# Patient Record
Sex: Female | Born: 1990 | Race: Black or African American | Hispanic: No | Marital: Single | Smoking: Never smoker
Health system: Southern US, Community
[De-identification: ages and names within clinical notes are randomized; demographics above are authoritative.]

## PROBLEM LIST (undated history)

## (undated) DIAGNOSIS — R112 Nausea with vomiting, unspecified: Secondary | ICD-10-CM

## (undated) DIAGNOSIS — R0602 Shortness of breath: Secondary | ICD-10-CM

## (undated) DIAGNOSIS — T4145XA Adverse effect of unspecified anesthetic, initial encounter: Secondary | ICD-10-CM

## (undated) DIAGNOSIS — R51 Headache: Secondary | ICD-10-CM

## (undated) DIAGNOSIS — R109 Unspecified abdominal pain: Secondary | ICD-10-CM

## (undated) DIAGNOSIS — T8859XA Other complications of anesthesia, initial encounter: Secondary | ICD-10-CM

## (undated) DIAGNOSIS — Z9889 Other specified postprocedural states: Secondary | ICD-10-CM

## (undated) DIAGNOSIS — R011 Cardiac murmur, unspecified: Secondary | ICD-10-CM

## (undated) HISTORY — PX: NO PAST SURGERIES: SHX2092

## (undated) HISTORY — DX: Cardiac murmur, unspecified: R01.1

## (undated) HISTORY — PX: REFRACTIVE SURGERY: SHX103

---
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
## 2010-01-22 ENCOUNTER — Inpatient Hospital Stay (HOSPITAL_COMMUNITY)
Admission: AD | Admit: 2010-01-22 | Discharge: 2010-01-22 | Disposition: A | Discharge status: Still In Hospital | Payer: Self-pay | Source: Home / Self Care | Attending: Obstetrics and Gynecology | Admitting: Obstetrics and Gynecology

## 2010-01-22 ENCOUNTER — Inpatient Hospital Stay (HOSPITAL_COMMUNITY)
Admission: AD | Admit: 2010-01-22 | Discharge: 2010-01-26 | Payer: Self-pay | Source: Home / Self Care | Attending: Obstetrics and Gynecology | Admitting: Obstetrics and Gynecology

## 2010-03-09 ENCOUNTER — Emergency Department (HOSPITAL_COMMUNITY)
Admission: EM | Admit: 2010-03-09 | Discharge: 2010-03-09 | Payer: Self-pay | Source: Home / Self Care | Admitting: Emergency Medicine

## 2010-04-12 ENCOUNTER — Ambulatory Visit: Payer: Self-pay | Admitting: Cardiology

## 2010-04-16 ENCOUNTER — Ambulatory Visit (INDEPENDENT_AMBULATORY_CARE_PROVIDER_SITE_OTHER): Payer: 59 | Admitting: Cardiology

## 2010-04-16 DIAGNOSIS — R011 Cardiac murmur, unspecified: Secondary | ICD-10-CM

## 2010-04-16 DIAGNOSIS — I379 Nonrheumatic pulmonary valve disorder, unspecified: Secondary | ICD-10-CM

## 2010-04-19 LAB — MRSA PCR SCREENING: MRSA by PCR: NEGATIVE

## 2010-04-19 LAB — URINALYSIS, ROUTINE W REFLEX MICROSCOPIC
Glucose, UA: NEGATIVE mg/dL
Ketones, ur: NEGATIVE mg/dL
Leukocytes, UA: NEGATIVE
Nitrite: NEGATIVE
Nitrite: NEGATIVE
Protein, ur: NEGATIVE mg/dL
Specific Gravity, Urine: 1.02 (ref 1.005–1.030)
pH: 7.5 (ref 5.0–8.0)

## 2010-04-19 LAB — COMPREHENSIVE METABOLIC PANEL
ALT: 14 U/L (ref 0–35)
Albumin: 2.7 g/dL — ABNORMAL LOW (ref 3.5–5.2)
Albumin: 3 g/dL — ABNORMAL LOW (ref 3.5–5.2)
Alkaline Phosphatase: 124 U/L — ABNORMAL HIGH (ref 39–117)
Alkaline Phosphatase: 197 U/L — ABNORMAL HIGH (ref 39–117)
Alkaline Phosphatase: 220 U/L — ABNORMAL HIGH (ref 39–117)
BUN: 2 mg/dL — ABNORMAL LOW (ref 6–23)
BUN: 7 mg/dL (ref 6–23)
Calcium: 9.3 mg/dL (ref 8.4–10.5)
Chloride: 105 mEq/L (ref 96–112)
Creatinine, Ser: 0.63 mg/dL (ref 0.4–1.2)
Glucose, Bld: 99 mg/dL (ref 70–99)
Potassium: 3.7 mEq/L (ref 3.5–5.1)
Potassium: 3.7 mEq/L (ref 3.5–5.1)
Potassium: 4.3 mEq/L (ref 3.5–5.1)
Sodium: 137 mEq/L (ref 135–145)
Total Bilirubin: 0.4 mg/dL (ref 0.3–1.2)
Total Protein: 5.2 g/dL — ABNORMAL LOW (ref 6.0–8.3)
Total Protein: 6.2 g/dL (ref 6.0–8.3)
Total Protein: 6.6 g/dL (ref 6.0–8.3)

## 2010-04-19 LAB — CBC
HCT: 32.4 % — ABNORMAL LOW (ref 36.0–46.0)
Hemoglobin: 11.6 g/dL — ABNORMAL LOW (ref 12.0–15.0)
Hemoglobin: 8.8 g/dL — ABNORMAL LOW (ref 12.0–15.0)
MCH: 32.1 pg (ref 26.0–34.0)
MCH: 32.4 pg (ref 26.0–34.0)
MCHC: 35.2 g/dL (ref 30.0–36.0)
MCHC: 35.8 g/dL (ref 30.0–36.0)
MCV: 89.8 fL (ref 78.0–100.0)
Platelets: 163 10*3/uL (ref 150–400)
Platelets: 179 10*3/uL (ref 150–400)
Platelets: 190 10*3/uL (ref 150–400)
Platelets: 218 10*3/uL (ref 150–400)
RBC: 2.77 MIL/uL — ABNORMAL LOW (ref 3.87–5.11)
RBC: 2.78 MIL/uL — ABNORMAL LOW (ref 3.87–5.11)
RBC: 3.61 MIL/uL — ABNORMAL LOW (ref 3.87–5.11)
RBC: 4.01 MIL/uL (ref 3.87–5.11)
RDW: 12.6 % (ref 11.5–15.5)
RDW: 12.7 % (ref 11.5–15.5)
WBC: 5.6 10*3/uL (ref 4.0–10.5)
WBC: 8.4 10*3/uL (ref 4.0–10.5)

## 2010-04-19 LAB — URIC ACID
Uric Acid, Serum: 4 mg/dL (ref 2.4–7.0)
Uric Acid, Serum: 4.4 mg/dL (ref 2.4–7.0)

## 2010-04-19 LAB — URINE CULTURE
Colony Count: NO GROWTH
Culture: NO GROWTH

## 2010-04-19 LAB — URINE MICROSCOPIC-ADD ON

## 2010-04-19 LAB — LACTATE DEHYDROGENASE: LDH: 186 U/L (ref 94–250)

## 2010-04-19 LAB — WET PREP, GENITAL
Trich, Wet Prep: NONE SEEN
Yeast Wet Prep HPF POC: NONE SEEN

## 2010-04-19 LAB — RPR: RPR Ser Ql: NONREACTIVE

## 2010-04-19 LAB — PROTEIN, URINE, 24 HOUR
Collection Interval-UPROT: 24 hours
Protein, Urine: 13 mg/dL
Urine Total Volume-UPROT: 2925 mL

## 2010-04-24 LAB — URINALYSIS, ROUTINE W REFLEX MICROSCOPIC
Bilirubin Urine: NEGATIVE
Ketones, ur: NEGATIVE mg/dL
Nitrite: NEGATIVE
Urobilinogen, UA: 1 mg/dL (ref 0.0–1.0)

## 2010-04-27 LAB — CBC
Hemoglobin: 12.8 g/dL (ref 12.0–15.0)
MCHC: 34.9 g/dL (ref 30.0–36.0)
MCV: 95.9 fL (ref 78.0–100.0)
RBC: 3.81 MIL/uL — ABNORMAL LOW (ref 3.87–5.11)

## 2010-04-27 LAB — URINE MICROSCOPIC-ADD ON

## 2010-04-27 LAB — GC/CHLAMYDIA PROBE AMP, GENITAL: GC Probe Amp, Genital: NEGATIVE

## 2010-04-27 LAB — POCT PREGNANCY, URINE: Preg Test, Ur: POSITIVE

## 2010-04-27 LAB — ABO/RH: ABO/RH(D): B POS

## 2010-04-27 LAB — URINALYSIS, ROUTINE W REFLEX MICROSCOPIC
Glucose, UA: NEGATIVE mg/dL
Hgb urine dipstick: NEGATIVE
Specific Gravity, Urine: 1.02 (ref 1.005–1.030)
pH: 7.5 (ref 5.0–8.0)

## 2010-04-27 LAB — WET PREP, GENITAL: Clue Cells Wet Prep HPF POC: NONE SEEN

## 2010-05-03 ENCOUNTER — Other Ambulatory Visit (HOSPITAL_COMMUNITY): Payer: Self-pay | Admitting: Radiology

## 2010-05-03 ENCOUNTER — Other Ambulatory Visit (HOSPITAL_COMMUNITY): Payer: 59 | Admitting: Radiology

## 2010-05-03 DIAGNOSIS — R011 Cardiac murmur, unspecified: Secondary | ICD-10-CM

## 2010-05-14 LAB — WET PREP, GENITAL
Trich, Wet Prep: NONE SEEN
WBC, Wet Prep HPF POC: NONE SEEN

## 2010-05-14 LAB — POCT I-STAT, CHEM 8
Chloride: 109 mEq/L (ref 96–112)
Glucose, Bld: 94 mg/dL (ref 70–99)
HCT: 37 % (ref 36.0–46.0)
Potassium: 3.7 mEq/L (ref 3.5–5.1)

## 2010-08-25 ENCOUNTER — Emergency Department (HOSPITAL_COMMUNITY): Payer: 59

## 2010-08-25 ENCOUNTER — Emergency Department (HOSPITAL_COMMUNITY)
Admission: EM | Admit: 2010-08-25 | Discharge: 2010-08-25 | Disposition: A | Discharge status: Home / Self Care | Payer: 59 | Attending: Emergency Medicine | Admitting: Emergency Medicine

## 2010-08-25 DIAGNOSIS — S0990XA Unspecified injury of head, initial encounter: Secondary | ICD-10-CM | POA: Insufficient documentation

## 2010-08-25 DIAGNOSIS — S20219A Contusion of unspecified front wall of thorax, initial encounter: Secondary | ICD-10-CM | POA: Insufficient documentation

## 2010-08-25 DIAGNOSIS — J3489 Other specified disorders of nose and nasal sinuses: Secondary | ICD-10-CM | POA: Insufficient documentation

## 2010-08-25 DIAGNOSIS — R071 Chest pain on breathing: Secondary | ICD-10-CM | POA: Insufficient documentation

## 2010-08-25 DIAGNOSIS — M542 Cervicalgia: Secondary | ICD-10-CM | POA: Insufficient documentation

## 2010-08-25 DIAGNOSIS — S139XXA Sprain of joints and ligaments of unspecified parts of neck, initial encounter: Secondary | ICD-10-CM | POA: Insufficient documentation

## 2010-08-25 DIAGNOSIS — R51 Headache: Secondary | ICD-10-CM | POA: Insufficient documentation

## 2010-11-11 ENCOUNTER — Inpatient Hospital Stay (INDEPENDENT_AMBULATORY_CARE_PROVIDER_SITE_OTHER)
Admission: RE | Admit: 2010-11-11 | Discharge: 2010-11-11 | Disposition: A | Discharge status: Home / Self Care | Payer: 59 | Source: Ambulatory Visit | Attending: Family Medicine | Admitting: Family Medicine

## 2010-11-11 DIAGNOSIS — Z0489 Encounter for examination and observation for other specified reasons: Secondary | ICD-10-CM

## 2010-11-11 LAB — POCT URINALYSIS DIP (DEVICE)
Glucose, UA: NEGATIVE mg/dL
Hgb urine dipstick: NEGATIVE
Nitrite: NEGATIVE
Protein, ur: 30 mg/dL — AB
Urobilinogen, UA: 0.2 mg/dL (ref 0.0–1.0)
pH: 7 (ref 5.0–8.0)

## 2010-11-11 LAB — WET PREP, GENITAL: Yeast Wet Prep HPF POC: NONE SEEN

## 2010-11-12 LAB — GC/CHLAMYDIA PROBE AMP, GENITAL
Chlamydia, DNA Probe: NEGATIVE
GC Probe Amp, Genital: NEGATIVE
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
## 2011-05-18 ENCOUNTER — Encounter: Payer: Self-pay | Admitting: *Deleted

## 2011-09-17 ENCOUNTER — Emergency Department (HOSPITAL_COMMUNITY): Payer: Self-pay

## 2011-09-17 ENCOUNTER — Emergency Department (HOSPITAL_COMMUNITY)
Admission: EM | Admit: 2011-09-17 | Discharge: 2011-09-17 | Disposition: A | Discharge status: Home / Self Care | Payer: Self-pay | Attending: Emergency Medicine | Admitting: Emergency Medicine

## 2011-09-17 DIAGNOSIS — S8391XA Sprain of unspecified site of right knee, initial encounter: Secondary | ICD-10-CM

## 2011-09-17 DIAGNOSIS — IMO0002 Reserved for concepts with insufficient information to code with codable children: Secondary | ICD-10-CM | POA: Insufficient documentation

## 2011-09-17 DIAGNOSIS — W108XXA Fall (on) (from) other stairs and steps, initial encounter: Secondary | ICD-10-CM | POA: Insufficient documentation

## 2011-09-17 MED ORDER — IBUPROFEN 600 MG PO TABS: 600.0000 mg | ORAL_TABLET | Freq: Four times a day (QID) | ORAL | Status: AC | PRN

## 2011-09-17 NOTE — ED Notes (Signed)
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

## 2011-09-17 NOTE — ED Provider Notes (Signed)
History     CSN: [id number]  Arrival date & time 09/17/11  2011   First MD Initiated Contact with Patient 09/17/11 2034      Chief Complaint  Patient presents with  . Knee Pain    (Consider location/radiation/quality/duration/timing/severity/associated sxs/prior treatment) HPI Hx from pt. Jaime Shelton is a 20 y.o. female presenting with c/o R knee pain. States she fell down some steps last weekend landing on the knee. States the pain has worsened since that time. Has been able to wt bear without difficulty. She works as a Advertising copywriter at [employer] and has been working a lot over the past week which she thinks may be making the pain worse. Pain located to medial aspect of knee, worse with palpation or activity. No distal numbness/weakness  Past Medical History  Diagnosis Date  . Heart murmur     No past surgical history on file.  Family History  Problem Relation Age of Onset  . HIV Father     History  Substance Use Topics  . Smoking status: Never Smoker   . Smokeless tobacco: Not on file  . Alcohol Use: No    OB History    Grav Para Term Preterm Abortions TAB SAB Ect Mult Living                  Review of Systems per HPI  Allergies  Review of patient's allergies indicates no known allergies.  Home Medications  No current outpatient prescriptions on file.  BP 133/67  Pulse 83  Temp 98.5 F (36.9 C) (Oral)  Resp 15  Ht 5\' 4"  (1.626 m)  Wt 112 lb (50.803 kg)  BMI 19.22 kg/m2  SpO2 100%  LMP 09/17/2011  Physical Exam  Nursing note and vitals reviewed. Constitutional: She appears well-developed and well-nourished. No distress.  HENT:  Head: Normocephalic and atraumatic.  Neck: Normal range of motion.  Cardiovascular: Normal rate.   Pulmonary/Chest: Effort normal.  Musculoskeletal: Normal range of motion.       R knee: ttp over medial bony aspect of knee. No edema, erythema, crepitus, effusion. Ligaments grossly intact to drawer/stress. Neg McMurrays. NVI  distally  Neurological: She is alert.  Skin: Skin is warm and dry. She is not diaphoretic.  Psychiatric: She has a normal mood and affect.    ED Course  Procedures (including critical care time)  Labs Reviewed - No data to display Dg Knee Complete 4 Views Right  09/17/2011  *RADIOLOGY REPORT*  Clinical Data: Fall  RIGHT KNEE - COMPLETE 4+ VIEW  Comparison: None.  Findings: No fracture or dislocation of the right knee.  No joint effusion.  IMPRESSION: No fracture or effusion.  Original Report Authenticated By: Genevive Bi, M.D.     1. Right knee sprain       MDM  Pt with ttp over medial bony aspect of knee after falling on her knee one week ago. No evidence of ligamentous injury on exam, xrays reviewed by me and neg. Pt advised RICE, NSAIDS. Reasons to return discussed.        Grant Fontana, PA-C 09/18/11 0041

## 2011-09-19 NOTE — ED Provider Notes (Signed)
Medical screening examination/treatment/procedure(s) were performed by non-physician practitioner and as supervising physician I was immediately available for consultation/collaboration. Devoria Albe, MD, Armando Gang   Ward Givens, MD 09/19/11 [phone]

## 2011-11-15 ENCOUNTER — Emergency Department (HOSPITAL_BASED_OUTPATIENT_CLINIC_OR_DEPARTMENT_OTHER)
Admission: EM | Admit: 2011-11-15 | Discharge: 2011-11-15 | Disposition: A | Discharge status: Home / Self Care | Payer: Medicaid Other | Attending: Emergency Medicine | Admitting: Emergency Medicine

## 2011-11-15 ENCOUNTER — Encounter (HOSPITAL_BASED_OUTPATIENT_CLINIC_OR_DEPARTMENT_OTHER): Payer: Self-pay

## 2011-11-15 DIAGNOSIS — M7989 Other specified soft tissue disorders: Secondary | ICD-10-CM | POA: Insufficient documentation

## 2011-11-15 MED ORDER — CEPHALEXIN 500 MG PO CAPS: 500.0000 mg | ORAL_CAPSULE | Freq: Four times a day (QID) | ORAL | Status: DC

## 2011-11-15 NOTE — ED Provider Notes (Signed)
History     CSN: [id number]  Arrival date & time 11/15/11  1328   First MD Initiated Contact with Patient 11/15/11 1416      Chief Complaint  Patient presents with  . Wound Infection    (Consider location/radiation/quality/duration/timing/severity/associated sxs/prior treatment) HPI Patient complaining of swelling in left axilla. It has been there for several months. She has not increased in size. She has not any drainage, redness, fever, or lines from this area. She states that she had a similar area in the right axilla which did become enlarged and required drainage of the Past Medical History  Diagnosis Date  . Heart murmur     History reviewed. No pertinent past surgical history.  Family History  Problem Relation Age of Onset  . HIV Father     History  Substance Use Topics  . Smoking status: Never Smoker   . Smokeless tobacco: Not on file  . Alcohol Use: No    OB History    Grav Para Term Preterm Abortions TAB SAB Ect Mult Living                  Review of Systems  Constitutional: Negative for fever, diaphoresis, appetite change, fatigue and unexpected weight change.  HENT: Negative for mouth sores and neck stiffness.   Eyes: Negative for visual disturbance.  Respiratory: Negative for cough, chest tightness, shortness of breath and wheezing.   Cardiovascular: Negative for chest pain.  Gastrointestinal: Negative for nausea, vomiting, abdominal pain, diarrhea and constipation.  Genitourinary: Negative for dysuria, urgency, frequency and hematuria.  Skin: Negative for rash.  Neurological: Negative for syncope, light-headedness and headaches.  Psychiatric/Behavioral: Negative for disturbed wake/sleep cycle. The patient is not nervous/anxious.     Allergies  Review of patient's allergies indicates no known allergies.  Home Medications  No current outpatient prescriptions on file.  BP 143/79  Pulse 56  Temp 98.5 F (36.9 C) (Oral)  Resp 16  Ht 5\' 4"   (1.626 m)  Wt 102 lb (46.267 kg)  BMI 17.51 kg/m2  LMP 10/12/2011  Physical Exam  Nursing note and vitals reviewed. Constitutional: She appears well-developed and well-nourished.  HENT:  Head: Normocephalic and atraumatic.  Eyes: Conjunctivae normal are normal. Pupils are equal, round, and reactive to light.  Neck: Normal range of motion. Neck supple.  Cardiovascular: Normal rate and regular rhythm.   Pulmonary/Chest: Effort normal and breath sounds normal.  Abdominal: Soft. Bowel sounds are normal.  Musculoskeletal: Normal range of motion.  Skin:       1 cm well circumscribed nodule left axilla with no tenderness and no erythema or streaking.  Psychiatric: She has a normal mood and affect. Her behavior is normal. Thought content normal.    ED Course  Procedures (including critical care time)  Labs Reviewed - No data to display No results found.   No diagnosis found.    MDM  Patient with swelling in left axilla that is nontender and well delineated. She'll be placed on antibiotics and given referral for followup if it does not resolve       Hilario Quarry, MD 11/15/11 1630

## 2011-11-15 NOTE — ED Notes (Signed)
Pt reports a knot in left axilla x 3 months.

## 2011-12-08 ENCOUNTER — Emergency Department (HOSPITAL_COMMUNITY)
Admission: EM | Admit: 2011-12-08 | Discharge: 2011-12-08 | Disposition: A | Discharge status: Home / Self Care | Payer: Medicaid Other | Attending: Emergency Medicine | Admitting: Emergency Medicine

## 2011-12-08 ENCOUNTER — Encounter (HOSPITAL_COMMUNITY): Payer: Self-pay | Admitting: *Deleted

## 2011-12-08 DIAGNOSIS — Z8679 Personal history of other diseases of the circulatory system: Secondary | ICD-10-CM | POA: Insufficient documentation

## 2011-12-08 DIAGNOSIS — R51 Headache: Secondary | ICD-10-CM | POA: Insufficient documentation

## 2011-12-08 DIAGNOSIS — H539 Unspecified visual disturbance: Secondary | ICD-10-CM | POA: Insufficient documentation

## 2011-12-08 MED ORDER — KETOROLAC TROMETHAMINE 30 MG/ML IJ SOLN
30.0000 mg | Freq: Once | INTRAMUSCULAR | Status: AC
  Administered 2011-12-08: 30 mg via INTRAMUSCULAR
  Filled 2011-12-08: qty 1

## 2011-12-08 NOTE — ED Provider Notes (Signed)
Medical screening examination/treatment/procedure(s) were performed by non-physician practitioner and as supervising physician I was immediately available for consultation/collaboration.  Kristan Brummitt M Vincenta Steffey, MD 12/08/11 0537 

## 2011-12-08 NOTE — ED Notes (Signed)
Headache for 2 years getting more worse with more frequency
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
## 2012-06-13 ENCOUNTER — Ambulatory Visit (INDEPENDENT_AMBULATORY_CARE_PROVIDER_SITE_OTHER): Payer: Medicaid Other | Admitting: General Surgery

## 2012-06-22 ENCOUNTER — Encounter (INDEPENDENT_AMBULATORY_CARE_PROVIDER_SITE_OTHER): Payer: Self-pay | Admitting: General Surgery

## 2012-06-22 ENCOUNTER — Ambulatory Visit (INDEPENDENT_AMBULATORY_CARE_PROVIDER_SITE_OTHER): Payer: Medicaid Other | Admitting: General Surgery

## 2012-06-22 VITALS — BP 118/62 | HR 62 | Temp 98.0°F | Resp 18 | Ht 64.0 in | Wt 103.3 lb

## 2012-06-22 DIAGNOSIS — L729 Follicular cyst of the skin and subcutaneous tissue, unspecified: Secondary | ICD-10-CM

## 2012-06-22 DIAGNOSIS — L723 Sebaceous cyst: Secondary | ICD-10-CM

## 2012-06-22 NOTE — Progress Notes (Signed)
Patient ID: Jaime Shelton, female   DOB: Dec 03, 1990, 22 y.o.   MRN: [id number]  Chief Complaint  Patient presents with  . New Evaluation    left axilla cyst    HPI Jaime Shelton is a 22 y.o. female.  This patient was referred by Dr. Bruna Potter for evaluation of axillary mass. She said that she had a similar mass under her right axilla but this spontaneously ruptured and hasn't caused any problems since. With this left armpit lesion she first several months ago and started out as a small mass and has slowly increased in size. She says it doesn't cause any discomfort and has not had any drainage or redness. She denies any fevers or chills. She did go to the emergency room for evaluation of this back in October and she was told to follow up with surgery HPI  Past Medical History  Diagnosis Date  . Heart murmur     No past surgical history on file.  Family History  Problem Relation Age of Onset  . HIV Father   . Hypertension Mother     Social History History  Substance Use Topics  . Smoking status: Never Smoker   . Smokeless tobacco: Not on file  . Alcohol Use: No    No Known Allergies  No current outpatient prescriptions on file.   No current facility-administered medications for this visit.    Review of Systems Review of Systems All other review of systems negative or noncontributory except as stated in the HPI  Blood pressure 118/62, pulse 62, temperature 98 F (36.7 C), resp. rate 18, height 5\' 4"  (1.626 m), weight 103 lb 4.8 oz (46.857 kg).  Physical Exam Physical Exam Physical Exam  Nursing note and vitals reviewed. Constitutional: She is oriented to person, place, and time. She appears well-developed and well-nourished, though very thin. No distress.  HENT:  Head: Normocephalic and atraumatic.  Mouth/Throat: No oropharyngeal exudate.  Eyes: Conjunctivae and EOM are normal. Pupils are equal, round, and reactive to light. Right eye exhibits no discharge. Left eye  exhibits no discharge. No scleral icterus.  Neck: Normal range of motion. Neck supple. No tracheal deviation present.  Cardiovascular: Normal rate, regular rhythm, normal heart sounds and intact distal pulses.   Pulmonary/Chest: Effort normal and breath sounds normal. No stridor. No respiratory distress. She has no wheezes.  Abdominal: Soft. Bowel sounds are normal. She exhibits no distension and no mass. There is no tenderness. There is no rebound and no guarding.  Musculoskeletal: Normal range of motion. She exhibits no edema and no tenderness.  Neurological: She is alert and oriented to person, place, and time.  Skin: Skin is warm and dry. No rash noted. She is not diaphoretic. No erythema. No pallor. She has a 2cm mobile and well circumscribed nodule in the left axilla without any tenderness, erythema, or drainage Psychiatric: She has a normal mood and affect. Her behavior is normal. Judgment and thought content normal.    Data Reviewed ER notes  Assessment    Left axillary mass  think that this most likely represents a benignSebaceous cyst.  There is no evidence of active infection. This could also be a lymph node as well as but given her history of a prior cyst which drained on the contralateral side, and I think that this is likely also a similar lesion. Discussed with her the options for continued observation versus excisional biopsy and she would like to have this removed. We discussed the procedure and the risks  including infection, bleeding, pain, scarring, recurrence, nerve injury, lymphedema and lymhocele and she expressed understanding and would like to proceed with excision of left axillary mass    Plan    We will plan to set her up for surgical excision        Nashly Olsson DAVID 06/22/2012, 11:39 AM

## 2012-06-25 ENCOUNTER — Encounter (HOSPITAL_COMMUNITY): Payer: Self-pay

## 2012-06-25 ENCOUNTER — Encounter (HOSPITAL_COMMUNITY)
Admission: RE | Admit: 2012-06-25 | Discharge: 2012-06-25 | Disposition: A | Discharge status: Home / Self Care | Payer: Medicaid Other | Source: Ambulatory Visit | Attending: General Surgery | Admitting: General Surgery

## 2012-06-25 VITALS — BP 134/74 | HR 79 | Temp 98.9°F | Resp 20 | Ht 64.0 in | Wt 102.7 lb

## 2012-06-25 DIAGNOSIS — L729 Follicular cyst of the skin and subcutaneous tissue, unspecified: Secondary | ICD-10-CM

## 2012-06-25 HISTORY — DX: Shortness of breath: R06.02

## 2012-06-25 HISTORY — DX: Other specified postprocedural states: R11.2

## 2012-06-25 HISTORY — DX: Headache: R51

## 2012-06-25 HISTORY — DX: Other specified postprocedural states: Z98.890

## 2012-06-25 HISTORY — DX: Unspecified abdominal pain: R10.9

## 2012-06-25 HISTORY — DX: Adverse effect of unspecified anesthetic, initial encounter: T41.45XA

## 2012-06-25 HISTORY — DX: Other complications of anesthesia, initial encounter: T88.59XA

## 2012-06-25 LAB — CBC
HCT: 34.3 % — ABNORMAL LOW (ref 36.0–46.0)
MCH: 30.7 pg (ref 26.0–34.0)
MCHC: 35 g/dL (ref 30.0–36.0)
MCV: 87.7 fL (ref 78.0–100.0)
RDW: 11.8 % (ref 11.5–15.5)

## 2012-06-25 MED ORDER — CEFAZOLIN SODIUM-DEXTROSE 2-3 GM-% IV SOLR
2.0000 g | INTRAVENOUS | Status: AC
  Administered 2012-06-26: 2 g via INTRAVENOUS
  Filled 2012-06-25: qty 50

## 2012-06-25 NOTE — Pre-Procedure Instructions (Signed)
Shelagh Rayman  06/25/2012   Your procedure is scheduled on: Tuesday, Jun 26, 2012  Report to [facility] at  8:45 AM.  Call this number if you have problems the morning of surgery: [phone]   Remember:   Do not eat food or drink liquids after midnight.   Take these medicines the morning of surgery with A SIP OF WATER:              Stop taking Aspirin,and herbal medications. Do not take any NSAIDs ie: Ibuprofen, Advil, Naproxen or any medication containing Aspirin.  Do not wear jewelry, make-up or nail polish.  Do not wear lotions, powders, or perfumes. You may wear deodorant.  Do not shave 48 hours prior to surgery. Men may shave face and neck.  Do not bring valuables to the hospital.  Contacts, dentures or bridgework may not be worn into surgery.  Leave suitcase in the car. After surgery it may be brought to your room.  For patients admitted to the hospital, checkout time is 11:00 AM the day of discharge.   Patients discharged the day of surgery will not be allowed to drive home.  Name and phone number of your driver:   Special Instructions: Shower using CHG 2 nights before surgery and the night before surgery.  If you shower the day of surgery use CHG.  Use special wash - you have one bottle of CHG for all showers.  You should use approximately 1/3 of the bottle for each shower.   Please read over the following fact sheets that you were given: Pain Booklet, Coughing and Deep Breathing and Surgical Site Infection Prevention

## 2012-06-26 ENCOUNTER — Ambulatory Visit (HOSPITAL_COMMUNITY)
Admission: RE | Admit: 2012-06-26 | Discharge: 2012-06-26 | Disposition: A | Discharge status: Home / Self Care | Payer: Medicaid Other | Source: Ambulatory Visit | Attending: General Surgery | Admitting: General Surgery

## 2012-06-26 ENCOUNTER — Encounter (HOSPITAL_COMMUNITY): Payer: Self-pay | Admitting: Certified Registered"

## 2012-06-26 ENCOUNTER — Ambulatory Visit (HOSPITAL_COMMUNITY): Payer: Medicaid Other | Admitting: Certified Registered"

## 2012-06-26 ENCOUNTER — Encounter (HOSPITAL_COMMUNITY): Payer: Self-pay | Admitting: *Deleted

## 2012-06-26 ENCOUNTER — Encounter (HOSPITAL_COMMUNITY)
Admission: RE | Disposition: A | Discharge status: Home / Self Care | Payer: Self-pay | Source: Ambulatory Visit | Attending: General Surgery

## 2012-06-26 DIAGNOSIS — R011 Cardiac murmur, unspecified: Secondary | ICD-10-CM | POA: Insufficient documentation

## 2012-06-26 DIAGNOSIS — L723 Sebaceous cyst: Secondary | ICD-10-CM

## 2012-06-26 DIAGNOSIS — L729 Follicular cyst of the skin and subcutaneous tissue, unspecified: Secondary | ICD-10-CM

## 2012-06-26 HISTORY — PX: EAR CYST EXCISION: SHX22

## 2012-06-26 HISTORY — PX: AXILLARY SURGERY: SHX892

## 2012-06-26 SURGERY — CYST REMOVAL
Anesthesia: General | Site: Axilla | Laterality: Left | Wound class: Clean

## 2012-06-26 MED ORDER — OXYCODONE HCL 5 MG/5ML PO SOLN: 5.0000 mg | Freq: Once | ORAL | Status: DC | PRN

## 2012-06-26 MED ORDER — LIDOCAINE-EPINEPHRINE (PF) 1 %-1:200000 IJ SOLN
INTRAMUSCULAR | Status: DC | PRN
  Administered 2012-06-26: 11:00:00

## 2012-06-26 MED ORDER — HYDROMORPHONE HCL PF 1 MG/ML IJ SOLN: 0.2500 mg | INTRAMUSCULAR | Status: DC | PRN

## 2012-06-26 MED ORDER — ARTIFICIAL TEARS OP OINT
TOPICAL_OINTMENT | OPHTHALMIC | Status: DC | PRN
  Administered 2012-06-26: 1 via OPHTHALMIC

## 2012-06-26 MED ORDER — PROPOFOL 10 MG/ML IV BOLUS
INTRAVENOUS | Status: DC | PRN
  Administered 2012-06-26: 140 mg via INTRAVENOUS

## 2012-06-26 MED ORDER — DEXAMETHASONE SODIUM PHOSPHATE 10 MG/ML IJ SOLN
INTRAMUSCULAR | Status: DC | PRN
  Administered 2012-06-26: 8 mg via INTRAVENOUS

## 2012-06-26 MED ORDER — LACTATED RINGERS IV SOLN
INTRAVENOUS | Status: DC | PRN
  Administered 2012-06-26: 11:00:00 via INTRAVENOUS

## 2012-06-26 MED ORDER — 0.9 % SODIUM CHLORIDE (POUR BTL) OPTIME
TOPICAL | Status: DC | PRN
  Administered 2012-06-26: 1000 mL

## 2012-06-26 MED ORDER — ONDANSETRON HCL 4 MG/2ML IJ SOLN
INTRAMUSCULAR | Status: DC | PRN
  Administered 2012-06-26: 4 mg via INTRAVENOUS

## 2012-06-26 MED ORDER — LIDOCAINE HCL (CARDIAC) 20 MG/ML IV SOLN
INTRAVENOUS | Status: DC | PRN
  Administered 2012-06-26: 100 mg via INTRAVENOUS

## 2012-06-26 MED ORDER — OXYCODONE HCL 5 MG PO TABS: 5.0000 mg | ORAL_TABLET | Freq: Once | ORAL | Status: DC | PRN

## 2012-06-26 MED ORDER — FENTANYL CITRATE 0.05 MG/ML IJ SOLN
INTRAMUSCULAR | Status: DC | PRN
Start: 2012-06-26 — End: 2012-06-26
  Administered 2012-06-26: 100 ug via INTRAVENOUS

## 2012-06-26 MED ORDER — MIDAZOLAM HCL 5 MG/5ML IJ SOLN
INTRAMUSCULAR | Status: DC | PRN
  Administered 2012-06-26: 2 mg via INTRAVENOUS

## 2012-06-26 MED ORDER — LACTATED RINGERS IV SOLN
INTRAVENOUS | Status: DC
  Administered 2012-06-26: 10:00:00 via INTRAVENOUS

## 2012-06-26 MED ORDER — BUPIVACAINE HCL (PF) 0.25 % IJ SOLN
INTRAMUSCULAR | Status: AC
  Filled 2012-06-26: qty 30

## 2012-06-26 MED ORDER — LIDOCAINE-EPINEPHRINE (PF) 1 %-1:200000 IJ SOLN
INTRAMUSCULAR | Status: AC
  Filled 2012-06-26: qty 10

## 2012-06-26 MED ORDER — BUPIVACAINE-EPINEPHRINE PF 0.25-1:200000 % IJ SOLN
INTRAMUSCULAR | Status: AC
  Filled 2012-06-26: qty 30

## 2012-06-26 MED ORDER — OXYCODONE HCL 5 MG PO TABS
5.0000 mg | ORAL_TABLET | Freq: Four times a day (QID) | ORAL | Status: DC | PRN
Start: 2012-06-26 — End: 2012-08-31

## 2012-06-26 SURGICAL SUPPLY — 48 items
ADH SKN CLS APL DERMABOND .7 (GAUZE/BANDAGES/DRESSINGS)
APL SKNCLS STERI-STRIP NONHPOA (GAUZE/BANDAGES/DRESSINGS) ×1
BENZOIN TINCTURE PRP APPL 2/3 (GAUZE/BANDAGES/DRESSINGS) ×1 IMPLANT
BLADE SURG 10 STRL SS (BLADE) ×2 IMPLANT
BLADE SURG 15 STRL LF DISP TIS (BLADE) ×1 IMPLANT
BLADE SURG 15 STRL SS (BLADE) ×2
CANISTER SUCTION 2500CC (MISCELLANEOUS) IMPLANT
CLOTH BEACON ORANGE TIMEOUT ST (SAFETY) ×2 IMPLANT
COVER SURGICAL LIGHT HANDLE (MISCELLANEOUS) ×2 IMPLANT
DECANTER SPIKE VIAL GLASS SM (MISCELLANEOUS) IMPLANT
DERMABOND ADVANCED (GAUZE/BANDAGES/DRESSINGS)
DERMABOND ADVANCED .7 DNX12 (GAUZE/BANDAGES/DRESSINGS) IMPLANT
DRAPE LAPAROSCOPIC ABDOMINAL (DRAPES) IMPLANT
DRAPE PED LAPAROTOMY (DRAPES) IMPLANT
DRAPE SURG 17X11 SM STRL (DRAPES) ×1 IMPLANT
DRAPE UTILITY 15X26 W/TAPE STR (DRAPE) ×4 IMPLANT
DRSG TEGADERM 4X4.75 (GAUZE/BANDAGES/DRESSINGS) IMPLANT
ELECT CAUTERY BLADE 6.4 (BLADE) ×2 IMPLANT
ELECT REM PT RETURN 9FT ADLT (ELECTROSURGICAL) ×2
ELECTRODE REM PT RTRN 9FT ADLT (ELECTROSURGICAL) ×1 IMPLANT
GLOVE BIOGEL PI IND STRL 7.0 (GLOVE) IMPLANT
GLOVE BIOGEL PI INDICATOR 7.0 (GLOVE) ×2
GLOVE SURG SS PI 7.0 STRL IVOR (GLOVE) ×1 IMPLANT
GLOVE SURG SS PI 7.5 STRL IVOR (GLOVE) ×4 IMPLANT
GOWN STRL NON-REIN LRG LVL3 (GOWN DISPOSABLE) ×2 IMPLANT
GOWN STRL REIN XL XLG (GOWN DISPOSABLE) ×2 IMPLANT
KIT BASIN OR (CUSTOM PROCEDURE TRAY) ×2 IMPLANT
KIT ROOM TURNOVER OR (KITS) ×2 IMPLANT
NDL HYPO 25X1 1.5 SAFETY (NEEDLE) ×1 IMPLANT
NEEDLE HYPO 25X1 1.5 SAFETY (NEEDLE) ×2 IMPLANT
NS IRRIG 1000ML POUR BTL (IV SOLUTION) ×2 IMPLANT
PACK SURGICAL SETUP 50X90 (CUSTOM PROCEDURE TRAY) ×2 IMPLANT
PAD ARMBOARD 7.5X6 YLW CONV (MISCELLANEOUS) ×2 IMPLANT
PENCIL BUTTON HOLSTER BLD 10FT (ELECTRODE) ×2 IMPLANT
SPECIMEN JAR MEDIUM (MISCELLANEOUS) ×2 IMPLANT
SPONGE GAUZE 4X4 12PLY (GAUZE/BANDAGES/DRESSINGS) ×1 IMPLANT
SPONGE LAP 18X18 X RAY DECT (DISPOSABLE) ×2 IMPLANT
STRIP CLOSURE SKIN 1/2X4 (GAUZE/BANDAGES/DRESSINGS) ×1 IMPLANT
SUT MNCRL AB 4-0 PS2 18 (SUTURE) ×2 IMPLANT
SUT VIC AB 3-0 SH 27 (SUTURE) ×2
SUT VIC AB 3-0 SH 27XBRD (SUTURE) ×1 IMPLANT
SYR BULB 3OZ (MISCELLANEOUS) ×2 IMPLANT
SYR CONTROL 10ML LL (SYRINGE) ×2 IMPLANT
TAPE CLOTH SURG 4X10 WHT LF (GAUZE/BANDAGES/DRESSINGS) ×1 IMPLANT
TOWEL OR 17X24 6PK STRL BLUE (TOWEL DISPOSABLE) ×2 IMPLANT
TOWEL OR 17X26 10 PK STRL BLUE (TOWEL DISPOSABLE) ×2 IMPLANT
TUBE CONNECTING 12X1/4 (SUCTIONS) IMPLANT
YANKAUER SUCT BULB TIP NO VENT (SUCTIONS) IMPLANT

## 2012-06-26 NOTE — Anesthesia Preprocedure Evaluation (Signed)
Anesthesia Evaluation  Patient identified by MRN, date of birth, ID band  Reviewed: Allergy & Precautions, H&P , NPO status , Patient's Chart, lab work & pertinent test results  History of Anesthesia Complications (+) PONV  Airway Mallampati: II TM Distance: >3 FB Neck ROM: Full    Dental no notable dental hx. (+) Teeth Intact and Dental Advisory Given   Pulmonary shortness of breath,  breath sounds clear to auscultation  Pulmonary exam normal       Cardiovascular + Valvular Problems/Murmurs Rhythm:Regular Rate:Normal     Neuro/Psych  Headaches, negative psych ROS   GI/Hepatic negative GI ROS, Neg liver ROS,   Endo/Other  negative endocrine ROS  Renal/GU negative Renal ROS  negative genitourinary   Musculoskeletal   Abdominal   Peds  Hematology negative hematology ROS (+)   Anesthesia Other Findings   Reproductive/Obstetrics negative OB ROS                           Anesthesia Physical Anesthesia Plan  ASA: II  Anesthesia Plan: General   Post-op Pain Management:    Induction: Intravenous  Airway Management Planned: LMA  Additional Equipment:   Intra-op Plan:   Post-operative Plan: Extubation in OR  Informed Consent: I have reviewed the patients History and Physical, chart, labs and discussed the procedure including the risks, benefits and alternatives for the proposed anesthesia with the patient or authorized representative who has indicated his/her understanding and acceptance.   Dental advisory given  Plan Discussed with: CRNA  Anesthesia Plan Comments:         Anesthesia Quick Evaluation

## 2012-06-26 NOTE — Brief Op Note (Signed)
06/26/2012  11:46 AM  PATIENT:  Jaime Shelton  22 y.o. female  PRE-OPERATIVE DIAGNOSIS:  skin cyst   POST-OPERATIVE DIAGNOSIS:  skin cyst   PROCEDURE:  Procedure(s): excison of left axillary CYST REMOVAL (Left)  SURGEON:  Surgeon(s) and Role:    * Lodema Pilot, DO - Primary  PHYSICIAN ASSISTANT:   ASSISTANTS: none   ANESTHESIA:   general  EBL:  Total I/O In: 500 [I.V.:500] Out: -   BLOOD ADMINISTERED:none  DRAINS: none   LOCAL MEDICATIONS USED:  MARCAINE    and LIDOCAINE   SPECIMEN:  Source of Specimen:  left axillary skin cyst  DISPOSITION OF SPECIMEN:  PATHOLOGY  COUNTS:  YES  TOURNIQUET:  * No tourniquets in log *  DICTATION: .Other Dictation: Dictation Number [id number]  PLAN OF CARE: Discharge to home after PACU  PATIENT DISPOSITION:  PACU - hemodynamically stable.   Delay start of Pharmacological VTE agent (>24hrs) due to surgical blood loss or risk of bleeding: no

## 2012-06-26 NOTE — Anesthesia Postprocedure Evaluation (Signed)
Anesthesia Post Note  Patient: Jaime Shelton  Procedure(s) Performed: Procedure(s) (LRB): excison of left axillary CYST REMOVAL (Left)  Anesthesia type: general  Patient location: PACU  Post pain: Pain level controlled  Post assessment: Patient's Cardiovascular Status Stable  Last Vitals:  Filed Vitals:   06/26/12 1254  BP:   Pulse: 70  Temp: 36.5 C  Resp: 16    Post vital signs: Reviewed and stable  Level of consciousness: sedated  Complications: No apparent anesthesia complications

## 2012-06-26 NOTE — H&P (View-Only) (Signed)
Patient ID: Jaime Shelton, female   DOB: 06/13/1990, 21 y.o.   MRN: [id number]  Chief Complaint  Patient presents with  . New Evaluation    left axilla cyst    HPI Jaime Shelton is a 21 y.o. female.  This patient was referred by Dr. Blount for evaluation of axillary mass. She said that she had a similar mass under her right axilla but this spontaneously ruptured and hasn't caused any problems since. With this left armpit lesion she first several months ago and started out as a small mass and has slowly increased in size. She says it doesn't cause any discomfort and has not had any drainage or redness. She denies any fevers or chills. She did go to the emergency room for evaluation of this back in October and she was told to follow up with surgery HPI  Past Medical History  Diagnosis Date  . Heart murmur     No past surgical history on file.  Family History  Problem Relation Age of Onset  . HIV Father   . Hypertension Mother     Social History History  Substance Use Topics  . Smoking status: Never Smoker   . Smokeless tobacco: Not on file  . Alcohol Use: No    No Known Allergies  No current outpatient prescriptions on file.   No current facility-administered medications for this visit.    Review of Systems Review of Systems All other review of systems negative or noncontributory except as stated in the HPI  Blood pressure 118/62, pulse 62, temperature 98 F (36.7 C), resp. rate 18, height 5' 4" (1.626 m), weight 103 lb 4.8 oz (46.857 kg).  Physical Exam Physical Exam Physical Exam  Nursing note and vitals reviewed. Constitutional: She is oriented to person, place, and time. She appears well-developed and well-nourished, though very thin. No distress.  HENT:  Head: Normocephalic and atraumatic.  Mouth/Throat: No oropharyngeal exudate.  Eyes: Conjunctivae and EOM are normal. Pupils are equal, round, and reactive to light. Right eye exhibits no discharge. Left eye  exhibits no discharge. No scleral icterus.  Neck: Normal range of motion. Neck supple. No tracheal deviation present.  Cardiovascular: Normal rate, regular rhythm, normal heart sounds and intact distal pulses.   Pulmonary/Chest: Effort normal and breath sounds normal. No stridor. No respiratory distress. She has no wheezes.  Abdominal: Soft. Bowel sounds are normal. She exhibits no distension and no mass. There is no tenderness. There is no rebound and no guarding.  Musculoskeletal: Normal range of motion. She exhibits no edema and no tenderness.  Neurological: She is alert and oriented to person, place, and time.  Skin: Skin is warm and dry. No rash noted. She is not diaphoretic. No erythema. No pallor. She has a 2cm mobile and well circumscribed nodule in the left axilla without any tenderness, erythema, or drainage Psychiatric: She has a normal mood and affect. Her behavior is normal. Judgment and thought content normal.    Data Reviewed ER notes  Assessment    Left axillary mass  think that this most likely represents a benignSebaceous cyst.  There is no evidence of active infection. This could also be a lymph node as well as but given her history of a prior cyst which drained on the contralateral side, and I think that this is likely also a similar lesion. Discussed with her the options for continued observation versus excisional biopsy and she would like to have this removed. We discussed the procedure and the risks   including infection, bleeding, pain, scarring, recurrence, nerve injury, lymphedema and lymhocele and she expressed understanding and would like to proceed with excision of left axillary mass    Plan    We will plan to set her up for surgical excision        Rejeana Fadness DAVID 06/22/2012, 11:39 AM    

## 2012-06-26 NOTE — Interval H&P Note (Signed)
History and Physical Interval Note:  06/26/2012 10:24 AM  Jaime Shelton  has presented today for surgery, with the diagnosis of skin cyst   The various methods of treatment have been discussed with the patient and family. After consideration of risks, benefits and other options for treatment, the patient has consented to  Procedure(s): excison of left axillary CYST REMOVAL (Left) as a surgical intervention .  The patient's history has been reviewed, patient examined, no change in status, stable for surgery.  I have reviewed the patient's chart and labs.  Questions were answered to the patient's satisfaction.  She was seen and examined with a chaperone.  Site marked with the patient agreement.  Risks of the procedure again discussed including infection, bleeding, pain, scarring, stricture, nerve injury, lymphocele and recurrence.  She desires to proceed with excision of left axillary mass.   Lodema Pilot DAVID

## 2012-06-26 NOTE — Transfer of Care (Signed)
Immediate Anesthesia Transfer of Care Note  Patient: Jaime Shelton  Procedure(s) Performed: Procedure(s): excison of left axillary CYST REMOVAL (Left)  Patient Location: PACU  Anesthesia Type:General  Level of Consciousness: awake, alert  and oriented  Airway & Oxygen Therapy: Patient Spontanous Breathing and Patient connected to nasal cannula oxygen  Post-op Assessment: Report given to PACU RN, Post -op Vital signs reviewed and stable and Patient moving all extremities X 4  Post vital signs: Reviewed and stable  Complications: No apparent anesthesia complications

## 2012-06-27 ENCOUNTER — Telehealth (INDEPENDENT_AMBULATORY_CARE_PROVIDER_SITE_OTHER): Payer: Self-pay | Admitting: General Surgery

## 2012-06-27 ENCOUNTER — Encounter (HOSPITAL_COMMUNITY): Payer: Self-pay | Admitting: General Surgery

## 2012-06-27 ENCOUNTER — Encounter (INDEPENDENT_AMBULATORY_CARE_PROVIDER_SITE_OTHER): Payer: Self-pay

## 2012-06-27 NOTE — Telephone Encounter (Signed)
Left message for patient to call for po f/u visit

## 2012-06-27 NOTE — Telephone Encounter (Signed)
Patient is coming in office around 230 to get a note for work . She states Dr Biagio Quint told her she would need to be out 1 week .I made Dr Biagio Quint nurse aware

## 2012-06-27 NOTE — Op Note (Signed)
NAME:  Jaime Shelton, Jaime Shelton NO.:  [id number]  MEDICAL RECORD NO.:  [id number]  LOCATION:  [facility]                         FACILITY:  [facility]  PHYSICIAN:  Lodema Pilot, MD       DATE OF BIRTH:  [birth year]  DATE OF PROCEDURE:  06/26/2012 DATE OF DISCHARGE:  06/26/2012                              OPERATIVE REPORT   PROCEDURE:  Excision of left axillary mass.  PREOPERATIVE DIAGNOSIS:  Axillary skin cyst.  POSTOPERATIVE DIAGNOSIS:  Axillary skin cyst.  SURGEON:  Lodema Pilot, MD  ASSISTANT:  None.  ANESTHESIA:  General LMA anesthesia with 19 mL of 1% lidocaine with epinephrine and 0.25% Marcaine in 50:50 mixture.  FLUID:  500 mL crystalloid.  ESTIMATED BLOOD LOSS:  Minimal.  DRAINS:  None.  SPECIMEN:  Left axillary skin cyst sent to pathology for permanent sectioning measuring 3 cm x 1.5 cm.  COMPLICATION:  None.  FINDINGS:  A 3 cm x 1.5 cm axillary skin cyst and cyst capsule was not entered during the dissection.  INDICATION OF PROCEDURE:  [name] is a 22 year old female with mass in the left axilla, which is increasing in size and desires excision.  OPERATIVE DETAILS:  [name] was seen evaluated in the preoperative area and risks and benefits of the procedure were again discussed in lay terms.  Informed consent was obtained and the surgical site was marked prior to anesthetic administration.  She was given prophylactic antibiotics and taken to the operating room, placed on table in supine position and general LMA anesthesia was obtained.  Her left axilla was prepped and draped in the standard surgical fashion.  Procedure time-out was performed with all operative team members to confirm proper patient, procedure.  An elliptical incision was made in the skin overlying the palpable mass and dissection was carried down to subcutaneous tissue using sharp dissection.  I dissected using sharp dissection along the cystic capsule taking care not to enter the  cyst and dissected circumferentially around this.  Entered the normal appearing subcutaneous tissue and carried this dissection around the cyst.  Again, the cyst was not entered.  The lesion was completely excised and measured on the back table measuring 3 cm long x 1.5 cm wide and sent to Pathology for permanent sectioning.  The wound was then irrigated with sterile saline solution and hemostasis was obtained with electrocautery. I really did not enter any of the axillary fat.  This was allowed to subcutaneous tissue.  The wound was injected with 19 mL of 1% lidocaine with epinephrine and 0.25% Marcaine in a 50:50 mixture and the wound was again noted to be hemostatic.  The dermis was approximated with interrupted 3-0 Vicryl sutures and skin edges were approximated with 4-0 Monocryl subcuticular suture.  Skin was washed and dried and benzoin Steri-Strips were applied.  Sterile dressing was applied.  All sponge, needle, and instrument counts were correct at the end of the case.  The patient tolerated the procedure well without apparent complications.          ______________________________ Lodema Pilot, MD     BL/MEDQ  D:  06/26/2012  T:  06/27/2012  Job:  [id number]

## 2012-07-06 ENCOUNTER — Ambulatory Visit (INDEPENDENT_AMBULATORY_CARE_PROVIDER_SITE_OTHER): Payer: Medicaid Other | Admitting: General Surgery

## 2012-07-06 ENCOUNTER — Encounter (INDEPENDENT_AMBULATORY_CARE_PROVIDER_SITE_OTHER): Payer: Self-pay | Admitting: General Surgery

## 2012-07-06 ENCOUNTER — Encounter (INDEPENDENT_AMBULATORY_CARE_PROVIDER_SITE_OTHER): Payer: Medicaid Other | Admitting: General Surgery

## 2012-07-06 VITALS — BP 104/60 | HR 74 | Temp 97.6°F | Resp 16 | Ht 64.0 in | Wt 104.6 lb

## 2012-07-06 DIAGNOSIS — Z4889 Encounter for other specified surgical aftercare: Secondary | ICD-10-CM

## 2012-07-06 DIAGNOSIS — Z5189 Encounter for other specified aftercare: Secondary | ICD-10-CM

## 2012-07-06 NOTE — Progress Notes (Signed)
Subjective:     Patient ID: Jaime Shelton, female   DOB: 1991/02/04, 22 y.o.   MRN: [id number]  HPI This patient follows up 9 days status post excision of left axillary skin cyst. Her pathology was consistent with an epidermal inclusion cyst. She has no complaints. She says that she doesn't have much discomfort from the area of and denies any fevers or chills.  Review of Systems     Objective:   Physical Exam The wound is healing nicely without sign of infection. The Steri-Strips have been removed. Again, there is no erythema or tenderness or swelling or sign of infection or lymphocele or hematoma    Assessment:     Status post excision of left axillary epidermal inclusion cysts-doing well She is recovering nicely from her procedure and there is no apparent complication. I recommended that she continue with another 2 weeks prior to applying the ureter and shaving the area of as well as another 2 weeks before she immerses this under the water. She can shower normally. Pathology was benign    Plan:     Continue with current wound care in see her back as needed.

## 2012-08-31 ENCOUNTER — Emergency Department (HOSPITAL_COMMUNITY)
Admission: EM | Admit: 2012-08-31 | Discharge: 2012-08-31 | Disposition: A | Discharge status: Home / Self Care | Payer: Medicaid Other | Attending: Emergency Medicine | Admitting: Emergency Medicine

## 2012-08-31 ENCOUNTER — Encounter (HOSPITAL_COMMUNITY): Payer: Self-pay | Admitting: *Deleted

## 2012-08-31 DIAGNOSIS — Z8679 Personal history of other diseases of the circulatory system: Secondary | ICD-10-CM | POA: Insufficient documentation

## 2012-08-31 DIAGNOSIS — Z8719 Personal history of other diseases of the digestive system: Secondary | ICD-10-CM | POA: Insufficient documentation

## 2012-08-31 DIAGNOSIS — R011 Cardiac murmur, unspecified: Secondary | ICD-10-CM | POA: Insufficient documentation

## 2012-08-31 DIAGNOSIS — M79605 Pain in left leg: Secondary | ICD-10-CM

## 2012-08-31 DIAGNOSIS — M79609 Pain in unspecified limb: Secondary | ICD-10-CM | POA: Insufficient documentation

## 2012-08-31 MED ORDER — IBUPROFEN 400 MG PO TABS: 400.0000 mg | ORAL_TABLET | Freq: Four times a day (QID) | ORAL | Status: DC | PRN

## 2012-08-31 NOTE — ED Notes (Signed)
C/o L shin pain, onset 1.5 weeks ago, ambulatory. Site Unremarkable, CMS intact.

## 2012-08-31 NOTE — ED Provider Notes (Signed)
CSN: [id number]     Arrival date & time 08/31/12  2017 History  This chart was scribed for non-physician practitioner working with Suzi Roots, MD, by Ardelia Mems ED Scribe. This patient was seen in room TR06C/TR06C and the patient's care was started at 9:55 PM.   First MD Initiated Contact with Patient 08/31/12 2102     Chief Complaint  Patient presents with  . Leg Pain    The history is provided by the patient. No language interpreter was used.   HPI Comments: Jaime Shelton is a 22 y.o. female who presents to the Emergency Department complaining of intermittent, moderate left shin pain that has been present "for a while", but became "significant" about 1.5 weeks ago. She denies any history of injury to the leg. She states that the pain is present some days and absent others, and it onsets without provocation. She states that when the pain is present she is unable to stand, but she has been ambulatory in the ED. She states that nothing makes her pain better or worse. Pt denies taking OTC medications at home to improve symptoms. Pt denies fever, nausea, vomiting, abdominal pain, SOB, neck pain, back pain or any pain or symptoms.  PCP- Dr. Clyda Greener   Past Medical History  Diagnosis Date  . Heart murmur   . Complication of anesthesia   . PONV (postoperative nausea and vomiting)   . Shortness of breath     Hx: of  . Headache(784.0)     Hx: of Migraines  . Abdominal pain     Hx: of   Past Surgical History  Procedure Laterality Date  . No past surgeries    . Ear cyst excision Left 06/26/2012    Procedure: excison of left axillary CYST REMOVAL;  Surgeon: Lodema Pilot, DO;  Location: [facility] OR;  Service: General;  Laterality: Left;  . Axillary surgery  06/26/2012    cyst removed   Family History  Problem Relation Age of Onset  . HIV Father   . Hypertension Mother    History  Substance Use Topics  . Smoking status: Never Smoker   . Smokeless tobacco: Never Used  . Alcohol  Use: Yes     Comment: occasional   OB History   Grav Para Term Preterm Abortions TAB SAB Ect Mult Living                 Review of Systems  Constitutional: Negative for fever.  HENT: Negative for neck pain.   Respiratory: Negative for shortness of breath.   Gastrointestinal: Negative for nausea, vomiting and abdominal pain.  Musculoskeletal: Negative for back pain.       Left shin pain.  All other systems reviewed and are negative.    Allergies  Review of patient's allergies indicates no known allergies.  Home Medications  No current outpatient prescriptions on file.  Triage Vitals: BP 118/66  Pulse 68  Temp(Src) 99 F (37.2 C) (Oral)  Resp 16  Ht 5\' 4"  (1.626 m)  Wt 103 lb (46.72 kg)  BMI 17.67 kg/m2  SpO2 100%  LMP 08/12/2012  Physical Exam  Nursing note and vitals reviewed. Constitutional: She is oriented to person, place, and time. She appears well-developed and well-nourished. No distress.  HENT:  Head: Normocephalic and atraumatic.  Right Ear: External ear normal.  Left Ear: External ear normal.  Nose: Nose normal.  Mouth/Throat: Oropharynx is clear and moist.  Eyes: Conjunctivae are normal.  Neck: Normal range of motion.  Cardiovascular: Normal rate, regular rhythm, normal heart sounds, intact distal pulses and normal pulses.   Pulmonary/Chest: Effort normal and breath sounds normal. No stridor. No respiratory distress. She has no wheezes. She has no rales.  Abdominal: Soft. She exhibits no distension.  Musculoskeletal: Normal range of motion.  Neurological: She is alert and oriented to person, place, and time. She has normal strength.  Skin: Skin is warm and dry. She is not diaphoretic. No erythema.  Psychiatric: She has a normal mood and affect. Her behavior is normal.    ED Course   Procedures (including critical care time)  DIAGNOSTIC STUDIES: Oxygen Saturation is 100% on RA, normal by my interpretation.    COORDINATION OF CARE: 10:12 PM- Pt  advised to follow-up with her PCP and pt agrees. Pt also agrees to take Advil and Tylenol as needed.   Labs Reviewed - No data to display  No results found.  1. Left leg pain     MDM  Patient with left leg pain. No neuro deficits. Walks without antalgic gait. Physical exam benign. Discussed that she needs to follow up with PCP to address this problem. Return instructions given. Vital signs stable for discharge. Patient / Family / Caregiver informed of clinical course, understand medical decision-making process, and agree with plan.     I personally performed the services described in this documentation, which was scribed in my presence. The recorded information has been reviewed and is accurate.     Mora Bellman, PA-C 09/01/12 0104

## 2012-09-04 NOTE — ED Provider Notes (Signed)
Medical screening examination/treatment/procedure(s) were performed by non-physician practitioner and as supervising physician I was immediately available for consultation/collaboration.   Suzi Roots, MD 09/04/12 [phone]

## 2012-10-13 ENCOUNTER — Encounter (HOSPITAL_COMMUNITY): Payer: Self-pay | Admitting: Emergency Medicine

## 2012-10-13 ENCOUNTER — Emergency Department (HOSPITAL_COMMUNITY)
Admission: EM | Admit: 2012-10-13 | Discharge: 2012-10-13 | Disposition: A | Discharge status: Home / Self Care | Payer: Medicaid Other | Source: Home / Self Care

## 2012-10-13 DIAGNOSIS — M65839 Other synovitis and tenosynovitis, unspecified forearm: Secondary | ICD-10-CM

## 2012-10-13 DIAGNOSIS — M25539 Pain in unspecified wrist: Secondary | ICD-10-CM

## 2012-10-13 DIAGNOSIS — M25532 Pain in left wrist: Secondary | ICD-10-CM

## 2012-10-13 DIAGNOSIS — M778 Other enthesopathies, not elsewhere classified: Secondary | ICD-10-CM

## 2012-10-13 MED ORDER — IBUPROFEN 800 MG PO TABS: 800.0000 mg | ORAL_TABLET | Freq: Three times a day (TID) | ORAL | Status: DC

## 2012-10-13 NOTE — ED Provider Notes (Signed)
CSN: [id number]     Arrival date & time 10/13/12  1310 History   None    Chief Complaint  Patient presents with  . Wrist Pain    left wrist pain since thursday. denies injury.    (Consider location/radiation/quality/duration/timing/severity/associated sxs/prior Treatment) HPI Comments: Patient presents with 3 days of left wrist pain. No injury. Pain is mild in the who wrist. Denies swelling, stiffness, erythema or warmth. ROM affects, rest helps. Has not taken any medications. No cervical or arm pain. Mild tingling.    Patient is a 22 y.o. female presenting with wrist pain. The history is provided by the patient.  Wrist Pain    Past Medical History  Diagnosis Date  . Heart murmur   . Complication of anesthesia   . PONV (postoperative nausea and vomiting)   . Shortness of breath     Hx: of  . Headache(784.0)     Hx: of Migraines  . Abdominal pain     Hx: of   Past Surgical History  Procedure Laterality Date  . No past surgeries    . Ear cyst excision Left 06/26/2012    Procedure: excison of left axillary CYST REMOVAL;  Surgeon: Lodema Pilot, DO;  Location: [facility] OR;  Service: General;  Laterality: Left;  . Axillary surgery  06/26/2012    cyst removed   Family History  Problem Relation Age of Onset  . HIV Father   . Hypertension Mother    History  Substance Use Topics  . Smoking status: Never Smoker   . Smokeless tobacco: Never Used  . Alcohol Use: Yes     Comment: occasional   OB History   Grav Para Term Preterm Abortions TAB SAB Ect Mult Living                 Review of Systems  All other systems reviewed and are negative.    Allergies  Review of patient's allergies indicates no known allergies.  Home Medications   Current Outpatient Rx  Name  Route  Sig  Dispense  Refill  . ibuprofen (ADVIL,MOTRIN) 400 MG tablet   Oral   Take 1 tablet (400 mg total) by mouth every 6 (six) hours as needed for pain.   30 tablet   0   . ibuprofen (ADVIL,MOTRIN) 800 MG  tablet   Oral   Take 1 tablet (800 mg total) by mouth 3 (three) times daily.   21 tablet   0    BP 126/78  Pulse 65  Temp(Src) 98.5 F (36.9 C) (Oral)  Resp 16  SpO2 100%  LMP 09/11/2012 Physical Exam  Nursing note and vitals reviewed. Constitutional: She appears well-developed and well-nourished. No distress.  HENT:  Head: Normocephalic and atraumatic.  Neck: Normal range of motion.  Musculoskeletal: Normal range of motion. She exhibits no edema and no tenderness.  Left wrist with FULL ROM, tender to palpation whole wrist. No swelling, erythema or warmth. Sensation and strength intact. Negative Tinel's and Phalens  Skin: Skin is warm and dry.  Psychiatric: Her behavior is normal.    ED Course  Procedures (including critical care time) Labs Review Labs Reviewed - No data to display Imaging Review No results found.  MDM   1. Wrist pain, acute, left   2. Tendonitis of wrist, left    Rest, ice. No need for xrays. 3 days of pain possible tendonitis vs. Mild CTS. NSAID's and f/u with Ortho if worsens.     Azucena Fallen, PA-C 10/13/12  1515 

## 2012-10-13 NOTE — ED Notes (Signed)
C/o left wrist pain since Thursday. Denies injury. States having a sharp shooting pain through wrist. Works at [employer] and does a lot of repetitive motions, typing etc.  Pt has not tried any otc meds for symptoms.

## 2012-10-14 NOTE — ED Provider Notes (Signed)
Medical screening examination/treatment/procedure(s) were performed by resident physician or non-physician practitioner and as supervising physician I was immediately available for consultation/collaboration.   KINDL,JAMES DOUGLAS MD.   James D Kindl, MD 10/14/12 1559 

## 2012-10-26 ENCOUNTER — Emergency Department (HOSPITAL_COMMUNITY)
Admission: EM | Admit: 2012-10-26 | Discharge: 2012-10-26 | Disposition: A | Discharge status: Home / Self Care | Payer: Medicaid Other | Attending: Emergency Medicine | Admitting: Emergency Medicine

## 2012-10-26 ENCOUNTER — Encounter (HOSPITAL_COMMUNITY): Payer: Self-pay | Admitting: Emergency Medicine

## 2012-10-26 DIAGNOSIS — N949 Unspecified condition associated with female genital organs and menstrual cycle: Secondary | ICD-10-CM | POA: Insufficient documentation

## 2012-10-26 DIAGNOSIS — R011 Cardiac murmur, unspecified: Secondary | ICD-10-CM | POA: Insufficient documentation

## 2012-10-26 DIAGNOSIS — Z3202 Encounter for pregnancy test, result negative: Secondary | ICD-10-CM | POA: Insufficient documentation

## 2012-10-26 DIAGNOSIS — A599 Trichomoniasis, unspecified: Secondary | ICD-10-CM | POA: Insufficient documentation

## 2012-10-26 DIAGNOSIS — N898 Other specified noninflammatory disorders of vagina: Secondary | ICD-10-CM | POA: Insufficient documentation

## 2012-10-26 LAB — WET PREP, GENITAL: Clue Cells Wet Prep HPF POC: NONE SEEN

## 2012-10-26 LAB — URINALYSIS, ROUTINE W REFLEX MICROSCOPIC
Protein, ur: NEGATIVE mg/dL
Specific Gravity, Urine: 1.027 (ref 1.005–1.030)
Urobilinogen, UA: 0.2 mg/dL (ref 0.0–1.0)

## 2012-10-26 LAB — URINE MICROSCOPIC-ADD ON

## 2012-10-26 MED ORDER — METRONIDAZOLE 500 MG PO TABS: 500.0000 mg | ORAL_TABLET | Freq: Two times a day (BID) | ORAL | Status: DC

## 2012-10-26 NOTE — ED Notes (Signed)
Pt states that her lips of vagina have been swelling since Monday, states it is sore to the point where she cannot wipe.

## 2012-10-26 NOTE — ED Provider Notes (Signed)
CSN: [id number]     Arrival date & time 10/26/12  1649 History   First MD Initiated Contact with Patient 10/26/12 1653     Chief Complaint  Patient presents with  . Groin Swelling   (Consider location/radiation/quality/duration/timing/severity/associated sxs/prior Treatment) HPI Comments: Patient presents with a chief complaint of "swelling" of the vagina.  She reports that the swelling has been present for the past 5 days.  She reports that she has never had anything like this before.  She also reports that the area is tender when she wipes.  She has also noticed whitish colored vaginal discharge.   She is currently sexually active with female partner.  She denies prior history of STD's.  She denies fever, chills, nausea, vomiting, pelvic pain, or abdominal pain.  Denies urinary symptoms.    The history is provided by the patient.    Past Medical History  Diagnosis Date  . Heart murmur   . Complication of anesthesia   . PONV (postoperative nausea and vomiting)   . Shortness of breath     Hx: of  . Headache(784.0)     Hx: of Migraines  . Abdominal pain     Hx: of   Past Surgical History  Procedure Laterality Date  . No past surgeries    . Ear cyst excision Left 06/26/2012    Procedure: excison of left axillary CYST REMOVAL;  Surgeon: Lodema Pilot, DO;  Location: [facility] OR;  Service: General;  Laterality: Left;  . Axillary surgery  06/26/2012    cyst removed   Family History  Problem Relation Age of Onset  . HIV Father   . Hypertension Mother    History  Substance Use Topics  . Smoking status: Never Smoker   . Smokeless tobacco: Never Used  . Alcohol Use: Yes     Comment: occasional   OB History   Grav Para Term Preterm Abortions TAB SAB Ect Mult Living                 Review of Systems  Genitourinary: Positive for vaginal discharge and vaginal pain. Negative for dysuria and genital sores.  All other systems reviewed and are negative.    Allergies  Review of  patient's allergies indicates no known allergies.  Home Medications   Current Outpatient Rx  Name  Route  Sig  Dispense  Refill  . ibuprofen (ADVIL,MOTRIN) 400 MG tablet   Oral   Take 1 tablet (400 mg total) by mouth every 6 (six) hours as needed for pain.   30 tablet   0   . ibuprofen (ADVIL,MOTRIN) 800 MG tablet   Oral   Take 1 tablet (800 mg total) by mouth 3 (three) times daily.   21 tablet   0    LMP 09/11/2012 Physical Exam  Nursing note and vitals reviewed. Constitutional: She appears well-developed and well-nourished.  HENT:  Head: Normocephalic and atraumatic.  Neck: Normal range of motion. Neck supple.  Cardiovascular: Normal rate, regular rhythm and normal heart sounds.   Pulmonary/Chest: Effort normal and breath sounds normal.  Abdominal: Soft. Bowel sounds are normal. She exhibits no distension and no mass. There is no tenderness. There is no rebound and no guarding. Hernia confirmed negative in the right inguinal area and confirmed negative in the left inguinal area.  Genitourinary: Vagina normal. There is no rash, tenderness or lesion on the right labia. There is no rash, tenderness or lesion on the left labia. Cervix exhibits no motion tenderness. Right adnexum  displays no mass, no tenderness and no fullness. Left adnexum displays no mass, no tenderness and no fullness. No erythema or tenderness around the vagina.  No vaginal swelling visualized  Lymphadenopathy:       Right: No inguinal adenopathy present.       Left: No inguinal adenopathy present.  Neurological: She is alert.  Skin: Skin is warm and dry.  Psychiatric: She has a normal mood and affect.    ED Course  Procedures (including critical care time) Labs Review Labs Reviewed  GC/CHLAMYDIA PROBE AMP  WET PREP, GENITAL  PREGNANCY, URINE  URINALYSIS, ROUTINE W REFLEX MICROSCOPIC   Imaging Review No results found.  MDM  No diagnosis found. Patient presenting with a chief complaint of  "swelling" of the vagina.  No swelling or lesions visualized on exam.  No adnexal tenderness or CMT with pelvic exam.  Wet prep negative, but UA did show Trich.  GC/Chlamydia pending.  Patient stable for discharge.  Patient instructed to follow up with GYN.  Return precautions given.    Pascal Lux Alexander, PA-C 10/27/12 [phone]

## 2012-10-27 NOTE — ED Provider Notes (Signed)
Medical screening examination/treatment/procedure(s) were performed by non-physician practitioner and as supervising physician I was immediately available for consultation/collaboration.  Hurman Horn, MD 10/27/12 1322

## 2012-10-28 LAB — GC/CHLAMYDIA PROBE AMP: CT Probe RNA: POSITIVE — AB

## 2012-10-29 NOTE — ED Notes (Signed)
+   chlamydia Chart sent to EDP office for review. 

## 2012-10-31 NOTE — ED Notes (Signed)
Chart returned from EDP office-Azithromycin 1 gram po x 1 dose need to be called to pharmacy.

## 2012-10-31 NOTE — ED Notes (Signed)
Unable to contact via phone letter sent to [facility] address. 

## 2012-11-14 ENCOUNTER — Telehealth (HOSPITAL_COMMUNITY): Payer: Self-pay | Admitting: Emergency Medicine

## 2012-11-18 ENCOUNTER — Emergency Department (HOSPITAL_COMMUNITY)
Admission: EM | Admit: 2012-11-18 | Discharge: 2012-11-18 | Disposition: A | Discharge status: Home / Self Care | Payer: Medicaid Other | Attending: Emergency Medicine | Admitting: Emergency Medicine

## 2012-11-18 ENCOUNTER — Encounter (HOSPITAL_COMMUNITY): Payer: Self-pay | Admitting: Emergency Medicine

## 2012-11-18 DIAGNOSIS — A749 Chlamydial infection, unspecified: Secondary | ICD-10-CM

## 2012-11-18 DIAGNOSIS — R1013 Epigastric pain: Secondary | ICD-10-CM | POA: Insufficient documentation

## 2012-11-18 DIAGNOSIS — R011 Cardiac murmur, unspecified: Secondary | ICD-10-CM | POA: Insufficient documentation

## 2012-11-18 DIAGNOSIS — R109 Unspecified abdominal pain: Secondary | ICD-10-CM

## 2012-11-18 LAB — URINALYSIS, ROUTINE W REFLEX MICROSCOPIC
Bilirubin Urine: NEGATIVE
Glucose, UA: NEGATIVE mg/dL
Ketones, ur: NEGATIVE mg/dL
Nitrite: NEGATIVE
Protein, ur: NEGATIVE mg/dL
Specific Gravity, Urine: 1.022 (ref 1.005–1.030)
Urobilinogen, UA: 1 mg/dL (ref 0.0–1.0)
pH: 7.5 (ref 5.0–8.0)

## 2012-11-18 LAB — URINE MICROSCOPIC-ADD ON

## 2012-11-18 MED ORDER — SUCRALFATE 1 G PO TABS
1.0000 g | ORAL_TABLET | Freq: Four times a day (QID) | ORAL | Status: DC
Start: 2012-11-18 — End: 2013-07-11

## 2012-11-18 MED ORDER — ONDANSETRON HCL 4 MG/2ML IJ SOLN
4.0000 mg | Freq: Once | INTRAMUSCULAR | Status: DC
  Filled 2012-11-18: qty 2

## 2012-11-18 MED ORDER — FAMOTIDINE 20 MG PO TABS: 20.0000 mg | ORAL_TABLET | Freq: Two times a day (BID) | ORAL | Status: DC

## 2012-11-18 MED ORDER — GI COCKTAIL ~~LOC~~
30.0000 mL | Freq: Once | ORAL | Status: AC
  Administered 2012-11-18: 30 mL via ORAL
  Filled 2012-11-18: qty 30

## 2012-11-18 MED ORDER — AZITHROMYCIN 250 MG PO TABS: ORAL_TABLET | ORAL | Status: DC

## 2012-11-18 MED ORDER — ONDANSETRON 8 MG PO TBDP
8.0000 mg | ORAL_TABLET | Freq: Once | ORAL | Status: AC
  Administered 2012-11-18: 8 mg via ORAL
  Filled 2012-11-18: qty 1

## 2012-11-18 MED ORDER — FAMOTIDINE 20 MG PO TABS
40.0000 mg | ORAL_TABLET | Freq: Once | ORAL | Status: AC
  Administered 2012-11-18: 40 mg via ORAL
  Filled 2012-11-18 (×2): qty 2

## 2012-11-18 NOTE — ED Provider Notes (Addendum)
CSN: [id number]     Arrival date & time 11/18/12  1315 History   First MD Initiated Contact with Patient 11/18/12 1332     Chief Complaint  Patient presents with  . Abdominal Pain   (Consider location/radiation/quality/duration/timing/severity/associated sxs/prior Treatment) Patient is a 22 y.o. female presenting with abdominal pain. The history is provided by the patient.  Abdominal Pain  patient here complaining of 24 hours of epigastric abdominal pain characterized as burning. No associated fever or chills. No vaginal bleeding or discharge. No urinary symptoms. Patient ate corn dogs without relief. No prior history of same. No medications used prior to arrival. Pain does not radiate to her back.  Past Medical History  Diagnosis Date  . Heart murmur   . Complication of anesthesia   . PONV (postoperative nausea and vomiting)   . Shortness of breath     Hx: of  . Headache(784.0)     Hx: of Migraines  . Abdominal pain     Hx: of   Past Surgical History  Procedure Laterality Date  . No past surgeries    . Ear cyst excision Left 06/26/2012    Procedure: excison of left axillary CYST REMOVAL;  Surgeon: Lodema Pilot, DO;  Location: [facility] OR;  Service: General;  Laterality: Left;  . Axillary surgery  06/26/2012    cyst removed   Family History  Problem Relation Age of Onset  . HIV Father   . Hypertension Mother    History  Substance Use Topics  . Smoking status: Never Smoker   . Smokeless tobacco: Never Used  . Alcohol Use: Yes     Comment: occasional   OB History   Grav Para Term Preterm Abortions TAB SAB Ect Mult Living                 Review of Systems  Gastrointestinal: Positive for abdominal pain.  All other systems reviewed and are negative.    Allergies  Review of patient's allergies indicates no known allergies.  Home Medications  No current outpatient prescriptions on file. BP 123/71  Pulse 63  Temp(Src) 98.1 F (36.7 C) (Oral)  Resp 16  SpO2  100% Physical Exam  Nursing note and vitals reviewed. Constitutional: She is oriented to person, place, and time. She appears well-developed and well-nourished.  Non-toxic appearance. No distress.  HENT:  Head: Normocephalic and atraumatic.  Eyes: Conjunctivae, EOM and lids are normal. Pupils are equal, round, and reactive to light.  Neck: Normal range of motion. Neck supple. No tracheal deviation present. No mass present.  Cardiovascular: Normal rate, regular rhythm and normal heart sounds.  Exam reveals no gallop.   No murmur heard. Pulmonary/Chest: Effort normal and breath sounds normal. No stridor. No respiratory distress. She has no decreased breath sounds. She has no wheezes. She has no rhonchi. She has no rales.  Abdominal: Soft. Normal appearance and bowel sounds are normal. She exhibits no distension. There is tenderness in the epigastric area. There is no rebound and no CVA tenderness.    Musculoskeletal: Normal range of motion. She exhibits no edema and no tenderness.  Neurological: She is alert and oriented to person, place, and time. She has normal strength. No cranial nerve deficit or sensory deficit. GCS eye subscore is 4. GCS verbal subscore is 5. GCS motor subscore is 6.  Skin: Skin is warm and dry. No abrasion and no rash noted.  Psychiatric: She has a normal mood and affect. Her speech is normal and behavior is normal.  ED Course  Procedures (including critical care time) Labs Review Labs Reviewed  URINALYSIS, ROUTINE W REFLEX MICROSCOPIC   Imaging Review No results found.  EKG Interpretation   None       MDM  No diagnosis found. Pt to be tx for Chlamydia from a prior visit. Suspect that she has GERD now and given check out to feels better. Also given Zofran and feels better. Be discharged home    Toy Baker, MD 11/18/12 1528  Toy Baker, MD 11/18/12 [phone]

## 2012-11-18 NOTE — ED Notes (Signed)
Pt reports abdominal pain since yesterday. Constant pain 10/10. No vomiting. Reports nausea. Last bowel movement yesterday.

## 2013-03-15 ENCOUNTER — Encounter: Payer: Self-pay | Admitting: Cardiology

## 2013-07-11 ENCOUNTER — Encounter (HOSPITAL_COMMUNITY): Payer: Self-pay | Admitting: Emergency Medicine

## 2013-07-11 ENCOUNTER — Emergency Department (HOSPITAL_COMMUNITY)
Admission: EM | Admit: 2013-07-11 | Discharge: 2013-07-11 | Disposition: A | Discharge status: Home / Self Care | Payer: Medicaid Other | Attending: Emergency Medicine | Admitting: Emergency Medicine

## 2013-07-11 DIAGNOSIS — R011 Cardiac murmur, unspecified: Secondary | ICD-10-CM | POA: Insufficient documentation

## 2013-07-11 DIAGNOSIS — Z3202 Encounter for pregnancy test, result negative: Secondary | ICD-10-CM | POA: Insufficient documentation

## 2013-07-11 DIAGNOSIS — Z8679 Personal history of other diseases of the circulatory system: Secondary | ICD-10-CM | POA: Insufficient documentation

## 2013-07-11 DIAGNOSIS — A5901 Trichomonal vulvovaginitis: Secondary | ICD-10-CM | POA: Insufficient documentation

## 2013-07-11 LAB — URINALYSIS, ROUTINE W REFLEX MICROSCOPIC
BILIRUBIN URINE: NEGATIVE
Glucose, UA: NEGATIVE mg/dL
HGB URINE DIPSTICK: NEGATIVE
Ketones, ur: NEGATIVE mg/dL
Nitrite: NEGATIVE
PH: 8 (ref 5.0–8.0)
Protein, ur: NEGATIVE mg/dL
SPECIFIC GRAVITY, URINE: 1.01 (ref 1.005–1.030)
UROBILINOGEN UA: 0.2 mg/dL (ref 0.0–1.0)

## 2013-07-11 LAB — URINE MICROSCOPIC-ADD ON

## 2013-07-11 LAB — POC URINE PREG, ED: Preg Test, Ur: NEGATIVE

## 2013-07-11 LAB — WET PREP, GENITAL
CLUE CELLS WET PREP: NONE SEEN
YEAST WET PREP: NONE SEEN

## 2013-07-11 MED ORDER — METRONIDAZOLE 500 MG PO TABS
2000.0000 mg | ORAL_TABLET | Freq: Once | ORAL | Status: AC
  Administered 2013-07-11: 2000 mg via ORAL
  Filled 2013-07-11: qty 4

## 2013-07-11 MED ORDER — AZITHROMYCIN 250 MG PO TABS
1000.0000 mg | ORAL_TABLET | Freq: Once | ORAL | Status: AC
  Administered 2013-07-11: 1000 mg via ORAL
  Filled 2013-07-11: qty 4

## 2013-07-11 MED ORDER — LIDOCAINE HCL 1 % IJ SOLN
INTRAMUSCULAR | Status: AC
  Administered 2013-07-11: 1.2 mL
  Filled 2013-07-11: qty 20

## 2013-07-11 MED ORDER — CEFTRIAXONE SODIUM 250 MG IJ SOLR
250.0000 mg | Freq: Once | INTRAMUSCULAR | Status: AC
  Administered 2013-07-11: 250 mg via INTRAMUSCULAR
  Filled 2013-07-11: qty 250

## 2013-07-11 NOTE — Discharge Instructions (Signed)
Trichomoniasis Trichomoniasis is an infection, caused by the Trichomonas organism, that affects both women and men. In women, the outer female genitalia and the vagina are affected. In men, the penis is mainly affected, but the prostate and other reproductive organs can also be involved. Trichomoniasis is a sexually transmitted disease (STD) and is most often passed to another person through sexual contact. The majority of people who get trichomoniasis do so from a sexual encounter and are also at risk for other STDs. CAUSES   Sexual intercourse with an infected partner.  It can be present in swimming pools or hot tubs. SYMPTOMS   Abnormal gray-green frothy vaginal discharge in women.  Vaginal itching and irritation in women.  Itching and irritation of the area outside the vagina in women.  Penile discharge with or without pain in males.  Inflammation of the urethra (urethritis), causing painful urination.  Bleeding after sexual intercourse. RELATED COMPLICATIONS  Pelvic inflammatory disease.  Infection of the uterus (endometritis).  Infertility.  Tubal (ectopic) pregnancy.  It can be associated with other STDs, including gonorrhea and chlamydia, hepatitis B, and HIV. COMPLICATIONS DURING PREGNANCY  Early (premature) delivery.  Premature rupture of the membranes (PROM).  Low birth weight. DIAGNOSIS   Visualization of Trichomonas under the microscope from the vagina discharge.  Ph of the vagina greater than 4.5, tested with a test tape.  Trich Rapid Test.  Culture of the organism, but this is not usually needed.  It may be found on a Pap test.  Having a "strawberry cervix,"which means the cervix looks very red like a strawberry. TREATMENT   You may be given medication to fight the infection. Inform your caregiver if you could be or are pregnant. Some medications used to treat the infection should not be taken during pregnancy.  Over-the-counter medications or  creams to decrease itching or irritation may be recommended.  Your sexual partner will need to be treated if infected. HOME CARE INSTRUCTIONS   Take all medication prescribed by your caregiver.  Take over-the-counter medication for itching or irritation as directed by your caregiver.  Do not have sexual intercourse while you have the infection.  Do not douche or wear tampons.  Discuss your infection with your partner, as your partner may have acquired the infection from you. Or, your partner may have been the person who transmitted the infection to you.  Have your sex partner examined and treated if necessary.  Practice safe, informed, and protected sex.  See your caregiver for other STD testing. SEEK MEDICAL CARE IF:   You still have symptoms after you finish the medication.  You have an oral temperature above 102 F (38.9 C).  You develop belly (abdominal) pain.  You have pain when you urinate.  You have bleeding after sexual intercourse.  You develop a rash.  The medication makes you sick or makes you throw up (vomit). Document Released: 07/20/2000 Document Revised: 04/18/2011 Document Reviewed: 08/15/2008 [facility] Patient Information 2014 [place], [place]. Safe Sex Safe sex is about reducing the risk of giving or getting a sexually transmitted disease (STD). STDs are spread through sexual contact involving the genitals, mouth, or rectum. Some STDS can be cured and others cannot. Safe sex can also prevent unintended pregnancies.  SAFE SEX PRACTICES  Limit your sexual activity to only one partner who is only having sex with you.  Talk to your partner about their past partners, past STDs, and drug use.  Use a condom every time you have sexual intercourse. This includes  vaginal, oral, and anal sexual activity. Both females and males should wear condoms during oral sex. Only use latex or polyurethane condoms and water-based lubricants. Petroleum-based lubricants or oils  used to lubricate a condom will weaken the condom and increase the chance that it will break. The condom should be in place from the beginning to the end of sexual activity. Wearing a condom reduces, but does not completely eliminate, your risk of getting or giving a STD. STDs can be spread by contact with skin of surrounding areas.  Get vaccinated for hepatitis B and HPV.  Avoid alcohol and recreational drugs which can affect your judgement. You may forget to use a condom or participate in high-risk sex.  For females, avoid douching after sexual intercourse. Douching can spread an infection farther into the reproductive tract.  Check your body for signs of sores, blisters, rashes, or unusual discharge. See your caregiver if you notice any of these signs.  Avoid sexual contact if you have symptoms of an infection or are being treated for an STD. If you or your partner has herpes, avoid sexual contact when blisters are present. Use condoms at all other times.  See your caregiver for regular screenings, examinations, and tests for STDs. Before having sex with a new partner, each of you should be screened for STDs and talk about the results with your partner. BENEFITS OF SAFE SEX   There is less of a chance of getting or giving an STD.  You can prevent unwanted or unintended pregnancies.  By discussing safer sex concerns with your partner, you may increase feelings of intimacy, comfort, trust, and honesty between the both of you. Document Released: 03/03/2004 Document Revised: 10/19/2011 Document Reviewed: 07/18/2011 [facility] Patient Information 2014 [place], [place].

## 2013-07-11 NOTE — ED Provider Notes (Signed)
CSN: [id number]     Arrival date & time 07/11/13  1456 History   First MD Initiated Contact with Patient 07/11/13 1530     Chief Complaint  Patient presents with  . Abdominal Pain     (Consider location/radiation/quality/duration/timing/severity/associated sxs/prior Treatment) Patient is a 23 y.o. female presenting with abdominal pain. The history is provided by the patient. No language interpreter was used.  Abdominal Pain Pain location:  Suprapubic Pain quality: aching   Pain radiates to:  Does not radiate Associated symptoms: vaginal discharge   Associated symptoms: no chills, no dysuria, no fever, no nausea, no vaginal bleeding and no vomiting   Associated symptoms comment:  Lower abdominal aching, like mild cramping, for the past 2 weeks. She reports vaginal discharge that was initially malodorous but the odor has resolved. No fever. She states she is late for her usual menses. She has a history of irregular periods that became regular after the birth of her 50 year old daughter. No dysuria, hematuria, low back pain.    Past Medical History  Diagnosis Date  . Heart murmur   . Complication of anesthesia   . PONV (postoperative nausea and vomiting)   . Shortness of breath     Hx: of  . Headache(784.0)     Hx: of Migraines  . Abdominal pain     Hx: of   Past Surgical History  Procedure Laterality Date  . No past surgeries    . Ear cyst excision Left 06/26/2012    Procedure: excison of left axillary CYST REMOVAL;  Surgeon: Lodema Pilot, DO;  Location: [facility] OR;  Service: General;  Laterality: Left;  . Axillary surgery  06/26/2012    cyst removed   Family History  Problem Relation Age of Onset  . HIV Father   . Hypertension Mother    History  Substance Use Topics  . Smoking status: Never Smoker   . Smokeless tobacco: Never Used  . Alcohol Use: Yes     Comment: occasional   OB History   Grav Para Term Preterm Abortions TAB SAB Ect Mult Living                 Review  of Systems  Constitutional: Negative for fever and chills.  Gastrointestinal: Positive for abdominal pain. Negative for nausea and vomiting.  Genitourinary: Positive for vaginal discharge and menstrual problem. Negative for dysuria and vaginal bleeding.  Musculoskeletal: Negative.  Negative for back pain.  Neurological: Negative.       Allergies  Review of patient's allergies indicates no known allergies.  Home Medications   Prior to Admission medications   Medication Sig Start Date End Date Taking? Authorizing Provider  ibuprofen (ADVIL,MOTRIN) 200 MG tablet Take 400 mg by mouth every 6 (six) hours as needed (pain).   Yes Historical Provider, MD   BP 126/95  Pulse 74  Temp(Src) 98.7 F (37.1 C)  Resp 20  SpO2 100%  LMP 05/27/2013 Physical Exam  Constitutional: She is oriented to person, place, and time. She appears well-developed and well-nourished.  Neck: Normal range of motion.  Pulmonary/Chest: Effort normal.  Genitourinary: Vaginal discharge found.  No CMT. There is mild bilateral adnexal tenderness without mass. Discharge is white, smooth, without malodor. Cervix is unremarkable in appearance.   Neurological: She is alert and oriented to person, place, and time.  Skin: Skin is warm and dry.    ED Course  Procedures (including critical care time) Labs Review Labs Reviewed  WET PREP, GENITAL  GC/CHLAMYDIA PROBE AMP  URINALYSIS, ROUTINE W REFLEX MICROSCOPIC  POC URINE PREG, ED    Imaging Review No results found.   EKG Interpretation None      MDM   Final diagnoses:  None    1. Trichomonal vaginitis  Patient is well appearing, non-tender abdomen and benign pelvic examination. Symptoms treated with medications in ED (Flagyl, Zithromax and Rocephin). She is encouraged to follow up with her doctor for recheck in one week.      Arnoldo HookerShari A Joriel Streety, PA-C 07/11/13 2133

## 2013-07-11 NOTE — ED Notes (Signed)
Pt having abdominal pain.  Started 2 weeks ago. Menstrual period is late by 1-2 weeks.  Pt also states had intercourse approx 1 month ago that was unprotected.  Pt having a vaginal discharge in which odor has decreased in last few days.

## 2013-07-13 LAB — GC/CHLAMYDIA PROBE AMP
CT PROBE, AMP APTIMA: NEGATIVE
GC Probe RNA: NEGATIVE

## 2013-07-13 NOTE — ED Provider Notes (Signed)
Medical screening examination/treatment/procedure(s) were conducted as a shared visit with non-physician practitioner(s) and myself.  I personally evaluated the patient during the encounter.   Pt c/o lower abd cramping. No fevers. abd soft nt. Labs. Pelvic.   Suzi Roots, MD 07/13/13 [phone]

## 2014-04-02 ENCOUNTER — Emergency Department (HOSPITAL_COMMUNITY)
Admission: EM | Admit: 2014-04-02 | Discharge: 2014-04-02 | Disposition: A | Discharge status: Home / Self Care | Payer: Medicaid Other | Attending: Emergency Medicine | Admitting: Emergency Medicine

## 2014-04-02 ENCOUNTER — Emergency Department (HOSPITAL_COMMUNITY): Payer: Medicaid Other

## 2014-04-02 ENCOUNTER — Encounter (HOSPITAL_COMMUNITY): Payer: Self-pay | Admitting: Emergency Medicine

## 2014-04-02 DIAGNOSIS — R011 Cardiac murmur, unspecified: Secondary | ICD-10-CM | POA: Diagnosis not present

## 2014-04-02 DIAGNOSIS — Z3202 Encounter for pregnancy test, result negative: Secondary | ICD-10-CM | POA: Insufficient documentation

## 2014-04-02 DIAGNOSIS — N730 Acute parametritis and pelvic cellulitis: Secondary | ICD-10-CM

## 2014-04-02 DIAGNOSIS — R1032 Left lower quadrant pain: Secondary | ICD-10-CM

## 2014-04-02 LAB — WET PREP, GENITAL
Trich, Wet Prep: NONE SEEN
Yeast Wet Prep HPF POC: NONE SEEN

## 2014-04-02 LAB — CBC WITH DIFFERENTIAL/PLATELET
Basophils Absolute: 0 10*3/uL (ref 0.0–0.1)
Basophils Relative: 0 % (ref 0–1)
EOS PCT: 4 % (ref 0–5)
Eosinophils Absolute: 0.2 10*3/uL (ref 0.0–0.7)
HEMATOCRIT: 36.6 % (ref 36.0–46.0)
HEMOGLOBIN: 12.4 g/dL (ref 12.0–15.0)
LYMPHS ABS: 2 10*3/uL (ref 0.7–4.0)
LYMPHS PCT: 39 % (ref 12–46)
MCH: 31 pg (ref 26.0–34.0)
MCHC: 33.9 g/dL (ref 30.0–36.0)
MCV: 91.5 fL (ref 78.0–100.0)
Monocytes Absolute: 0.4 10*3/uL (ref 0.1–1.0)
Monocytes Relative: 8 % (ref 3–12)
NEUTROS ABS: 2.5 10*3/uL (ref 1.7–7.7)
Neutrophils Relative %: 49 % (ref 43–77)
Platelets: 278 10*3/uL (ref 150–400)
RBC: 4 MIL/uL (ref 3.87–5.11)
RDW: 12.3 % (ref 11.5–15.5)
WBC: 5.2 10*3/uL (ref 4.0–10.5)

## 2014-04-02 LAB — URINALYSIS, ROUTINE W REFLEX MICROSCOPIC
Bilirubin Urine: NEGATIVE
GLUCOSE, UA: NEGATIVE mg/dL
Hgb urine dipstick: NEGATIVE
Ketones, ur: NEGATIVE mg/dL
Nitrite: NEGATIVE
PH: 7.5 (ref 5.0–8.0)
Protein, ur: NEGATIVE mg/dL
Specific Gravity, Urine: 1.014 (ref 1.005–1.030)
Urobilinogen, UA: 0.2 mg/dL (ref 0.0–1.0)

## 2014-04-02 LAB — COMPREHENSIVE METABOLIC PANEL
ALT: 11 U/L (ref 0–35)
AST: 20 U/L (ref 0–37)
Albumin: 3.9 g/dL (ref 3.5–5.2)
Alkaline Phosphatase: 44 U/L (ref 39–117)
Anion gap: 5 (ref 5–15)
BUN: 8 mg/dL (ref 6–23)
CALCIUM: 9 mg/dL (ref 8.4–10.5)
CO2: 29 mmol/L (ref 19–32)
Chloride: 106 mmol/L (ref 96–112)
Creatinine, Ser: 0.57 mg/dL (ref 0.50–1.10)
GLUCOSE: 90 mg/dL (ref 70–99)
Potassium: 4.5 mmol/L (ref 3.5–5.1)
SODIUM: 140 mmol/L (ref 135–145)
TOTAL PROTEIN: 7.1 g/dL (ref 6.0–8.3)
Total Bilirubin: 0.3 mg/dL (ref 0.3–1.2)

## 2014-04-02 LAB — URINE MICROSCOPIC-ADD ON

## 2014-04-02 LAB — PREGNANCY, URINE: Preg Test, Ur: NEGATIVE

## 2014-04-02 MED ORDER — DOXYCYCLINE HYCLATE 100 MG PO TABS
100.0000 mg | ORAL_TABLET | Freq: Two times a day (BID) | ORAL | Status: DC
Start: 2014-04-02 — End: 2018-01-01

## 2014-04-02 MED ORDER — LIDOCAINE HCL 1 % IJ SOLN
INTRAMUSCULAR | Status: AC
Start: 2014-04-02 — End: 2014-04-02
  Administered 2014-04-02: 1 mL
  Filled 2014-04-02: qty 20

## 2014-04-02 MED ORDER — CEFTRIAXONE SODIUM 250 MG IJ SOLR
250.0000 mg | Freq: Once | INTRAMUSCULAR | Status: AC
  Administered 2014-04-02: 250 mg via INTRAMUSCULAR
  Filled 2014-04-02: qty 250

## 2014-04-02 MED ORDER — OXYCODONE-ACETAMINOPHEN 5-325 MG PO TABS
2.0000 | ORAL_TABLET | Freq: Once | ORAL | Status: AC
Start: 2014-04-02 — End: 2014-04-02
  Administered 2014-04-02: 2 via ORAL
  Filled 2014-04-02: qty 2

## 2014-04-02 MED ORDER — ONDANSETRON 4 MG PO TBDP
4.0000 mg | ORAL_TABLET | Freq: Once | ORAL | Status: AC
  Administered 2014-04-02: 4 mg via ORAL
  Filled 2014-04-02: qty 1

## 2014-04-02 MED ORDER — AZITHROMYCIN 250 MG PO TABS
1000.0000 mg | ORAL_TABLET | Freq: Every day | ORAL | Status: DC
Start: 2014-04-02 — End: 2014-04-02
  Administered 2014-04-02: 1000 mg via ORAL
  Filled 2014-04-02: qty 4

## 2014-04-02 MED ORDER — DOXYCYCLINE HYCLATE 100 MG PO TABS
100.0000 mg | ORAL_TABLET | Freq: Once | ORAL | Status: AC
  Administered 2014-04-02: 100 mg via ORAL
  Filled 2014-04-02: qty 1

## 2014-04-02 MED ORDER — OXYCODONE-ACETAMINOPHEN 5-325 MG PO TABS: 1.0000 | ORAL_TABLET | Freq: Four times a day (QID) | ORAL | Status: DC | PRN

## 2014-04-02 NOTE — ED Provider Notes (Signed)
CSN: [id number]     Arrival date & time 04/02/14  1548 History   First MD Initiated Contact with Patient 04/02/14 1600     Chief Complaint  Patient presents with  . Abdominal Pain     (Consider location/radiation/quality/duration/timing/severity/associated sxs/prior Treatment) Patient is a 24 y.o. female presenting with abdominal pain. The history is provided by the patient.  Abdominal Pain Pain location:  LLQ Pain quality: sharp   Pain radiates to:  Does not radiate Pain severity:  Moderate Onset quality:  Sudden Timing:  Constant Progression:  Unchanged Chronicity:  New Context comment:  Spontaneously Relieved by:  Nothing Worsened by:  Nothing tried Associated symptoms: no cough, no dysuria, no fever, no shortness of breath and no vomiting     Past Medical History  Diagnosis Date  . Heart murmur   . Complication of anesthesia   . PONV (postoperative nausea and vomiting)   . Shortness of breath     Hx: of  . Headache(784.0)     Hx: of Migraines  . Abdominal pain     Hx: of   Past Surgical History  Procedure Laterality Date  . No past surgeries    . Ear cyst excision Left 06/26/2012    Procedure: excison of left axillary CYST REMOVAL;  Surgeon: Lodema Pilot, DO;  Location: [facility] OR;  Service: General;  Laterality: Left;  . Axillary surgery  06/26/2012    cyst removed   Family History  Problem Relation Age of Onset  . HIV Father   . Hypertension Mother    History  Substance Use Topics  . Smoking status: Never Smoker   . Smokeless tobacco: Never Used  . Alcohol Use: Yes     Comment: occasional   OB History    No data available     Review of Systems  Constitutional: Negative for fever.  Respiratory: Negative for cough and shortness of breath.   Gastrointestinal: Negative for vomiting and abdominal pain.  Genitourinary: Negative for dysuria.  All other systems reviewed and are negative.     Allergies  Review of patient's allergies indicates no known  allergies.  Home Medications   Prior to Admission medications   Medication Sig Start Date End Date Taking? Authorizing Provider  ibuprofen (ADVIL,MOTRIN) 200 MG tablet Take 400 mg by mouth every 6 (six) hours as needed (pain).   Yes Historical Provider, MD  doxycycline (VIBRA-TABS) 100 MG tablet Take 1 tablet (100 mg total) by mouth 2 (two) times daily. 04/02/14   Elwin Mocha, MD  oxyCODONE-acetaminophen (PERCOCET/ROXICET) 5-325 MG per tablet Take 1 tablet by mouth every 6 (six) hours as needed for severe pain. 04/02/14   Elwin Mocha, MD   BP 129/92 mmHg  Pulse 83  Temp(Src) 98.5 F (36.9 C) (Oral)  Resp 16  SpO2 100%  LMP 03/14/2014 (Exact Date) Physical Exam  Constitutional: She is oriented to person, place, and time. She appears well-developed and well-nourished. No distress.  HENT:  Head: Normocephalic and atraumatic.  Mouth/Throat: Oropharynx is clear and moist.  Eyes: EOM are normal. Pupils are equal, round, and reactive to light.  Neck: Normal range of motion. Neck supple.  Cardiovascular: Normal rate and regular rhythm.  Exam reveals no friction rub.   No murmur heard. Pulmonary/Chest: Effort normal and breath sounds normal. No respiratory distress. She has no wheezes. She has no rales.  Abdominal: Soft. She exhibits no distension. There is tenderness (LLQ, low in the pelvis. Mild RLQ pain. Mild suprapubic pain.). There is no  rebound.  Genitourinary: Cervix exhibits discharge (white). Cervix exhibits no motion tenderness. Right adnexum displays tenderness. Right adnexum displays no mass and no fullness. Left adnexum displays tenderness and fullness. Left adnexum displays no mass.  Musculoskeletal: Normal range of motion. She exhibits no edema.  Neurological: She is alert and oriented to person, place, and time.  Skin: She is not diaphoretic.  Nursing note and vitals reviewed.   ED Course  Procedures (including critical care time) Labs Review Labs Reviewed  WET PREP,  GENITAL - Abnormal; Notable for the following:    Clue Cells Wet Prep HPF POC FEW (*)    WBC, Wet Prep HPF POC TOO NUMEROUS TO COUNT (*)    All other components within normal limits  URINALYSIS, ROUTINE W REFLEX MICROSCOPIC - Abnormal; Notable for the following:    Leukocytes, UA MODERATE (*)    All other components within normal limits  URINE MICROSCOPIC-ADD ON - Abnormal; Notable for the following:    Squamous Epithelial / LPF MANY (*)    Bacteria, UA FEW (*)    All other components within normal limits  CBC WITH DIFFERENTIAL/PLATELET  COMPREHENSIVE METABOLIC PANEL  PREGNANCY, URINE  GC/CHLAMYDIA PROBE AMP ([place])    Imaging Review US Transvaginal Non-ob  04/02/2014   CLINICAL DATA:  Left lower quadrant pain for 8 hr.  EXAM: TRANSABDOMINAL AND TRANSVAGINAL ULTRASOUND OF PELVIS  DOPPLER ULTRASOUND OF OVARIES  TECHNIQUE: Both transabdominal and transvaginal ultrasound examinations of the pelvis were performed. Transabdominal technique was performed for global imaging of the pelvis including uterus, ovaries, adnexal regions, and pelvic cul-de-sac.  It was necessary to proceed with endovaginal exam following the transabdominal exam to visualize the ovaries. Color and duplex Doppler ultrasound was utilized to evaluate blood flow to the ovaries.  COMPARISON:  None.  FINDINGS: Uterus  Measurements: 8.3 x 4.2 x 5.6 cm on the anteverted. No fibroids or other mass visualized. Small nabothian cysts in the cervix.  Endometrium  Thickness: 12.2 mm.  No focal abnormality visualized.  Right ovary  Measurements: 2.9 x 2.2 x 1.9 cm. Normal appearance/no adnexal mass.  Left ovary  Measurements: 2.9 x 2 x 2.3 cm. Normal appearance/no adnexal mass.  Pulsed Doppler evaluation of both ovaries demonstrates normal low-resistance arterial and venous waveforms. Flow is demonstrated in both ovaries on color flow Doppler imaging.  Other findings  Small amount of free fluid in the pelvis, likely physiologic.   IMPRESSION: Normal appearance of the uterus and ovaries. No evidence of significant ovarian mass or torsion. Small amount of free fluid in the pelvis likely physiologic.   Electronically Signed   By: Burman Nieves M.D.   On: 04/02/2014 17:45   US Pelvis Complete  04/02/2014   CLINICAL DATA:  Left lower quadrant pain for 8 hr.  EXAM: TRANSABDOMINAL AND TRANSVAGINAL ULTRASOUND OF PELVIS  DOPPLER ULTRASOUND OF OVARIES  TECHNIQUE: Both transabdominal and transvaginal ultrasound examinations of the pelvis were performed. Transabdominal technique was performed for global imaging of the pelvis including uterus, ovaries, adnexal regions, and pelvic cul-de-sac.  It was necessary to proceed with endovaginal exam following the transabdominal exam to visualize the ovaries. Color and duplex Doppler ultrasound was utilized to evaluate blood flow to the ovaries.  COMPARISON:  None.  FINDINGS: Uterus  Measurements: 8.3 x 4.2 x 5.6 cm on the anteverted. No fibroids or other mass visualized. Small nabothian cysts in the cervix.  Endometrium  Thickness: 12.2 mm.  No focal abnormality visualized.  Right ovary  Measurements: 2.9 x  2.2 x 1.9 cm. Normal appearance/no adnexal mass.  Left ovary  Measurements: 2.9 x 2 x 2.3 cm. Normal appearance/no adnexal mass.  Pulsed Doppler evaluation of both ovaries demonstrates normal low-resistance arterial and venous waveforms. Flow is demonstrated in both ovaries on color flow Doppler imaging.  Other findings  Small amount of free fluid in the pelvis, likely physiologic.  IMPRESSION: Normal appearance of the uterus and ovaries. No evidence of significant ovarian mass or torsion. Small amount of free fluid in the pelvis likely physiologic.   Electronically Signed   By: Burman NievesWilliam  Stevens M.D.   On: 04/02/2014 17:45   [place] Art/ven Flow Abd Pelv Doppler  04/02/2014   CLINICAL DATA:  Left lower quadrant pain for 8 hr.  EXAM: TRANSABDOMINAL AND TRANSVAGINAL ULTRASOUND OF PELVIS  DOPPLER ULTRASOUND  OF OVARIES  TECHNIQUE: Both transabdominal and transvaginal ultrasound examinations of the pelvis were performed. Transabdominal technique was performed for global imaging of the pelvis including uterus, ovaries, adnexal regions, and pelvic cul-de-sac.  It was necessary to proceed with endovaginal exam following the transabdominal exam to visualize the ovaries. Color and duplex Doppler ultrasound was utilized to evaluate blood flow to the ovaries.  COMPARISON:  None.  FINDINGS: Uterus  Measurements: 8.3 x 4.2 x 5.6 cm on the anteverted. No fibroids or other mass visualized. Small nabothian cysts in the cervix.  Endometrium  Thickness: 12.2 mm.  No focal abnormality visualized.  Right ovary  Measurements: 2.9 x 2.2 x 1.9 cm. Normal appearance/no adnexal mass.  Left ovary  Measurements: 2.9 x 2 x 2.3 cm. Normal appearance/no adnexal mass.  Pulsed Doppler evaluation of both ovaries demonstrates normal low-resistance arterial and venous waveforms. Flow is demonstrated in both ovaries on color flow Doppler imaging.  Other findings  Small amount of free fluid in the pelvis, likely physiologic.  IMPRESSION: Normal appearance of the uterus and ovaries. No evidence of significant ovarian mass or torsion. Small amount of free fluid in the pelvis likely physiologic.   Electronically Signed   By: Burman NievesWilliam  Stevens M.D.   On: 04/02/2014 17:45     EKG Interpretation None      MDM   Final diagnoses:  LLQ pain  PID (acute pelvic inflammatory disease)    11F presents with LLQ pain that began suddenly this morning. Constant since. No N/V, no dysuria, no diarrhea. No concern for STDs. No hx of ovarian cysts. About one month of white vaginal discharge. On exam, LLQ pain low in the pelvis. On pelvic bilateral adnexal pain, L>>R. Mild L adnexal fullness. Concern for ovarian torsion vs cyst. Will US. US negative. White cells on urine, many WBCs on wet prep. No urinary symptoms. Clinical picture c/w PID. Given  rocephin/azithro, PO doxy Rx. Stable for discharge.  Elwin MochaBlair Brittnye Josephs, MD 04/02/14 [phone]

## 2014-04-02 NOTE — ED Notes (Signed)
Pt c/o lower left abd pain that started this morning and is getting worse ever since. LMP 03/14/14. Pt denies n/v or vagial bleeding but states she does have discharge.

## 2014-04-02 NOTE — Discharge Instructions (Signed)
Pelvic Inflammatory Disease °Pelvic inflammatory disease (PID) refers to an infection in some or all of the female organs. The infection can be in the uterus, ovaries, fallopian tubes, or the surrounding tissues in the pelvis. PID can cause abdominal or pelvic pain that comes on suddenly (acute pelvic pain). PID is a serious infection because it can lead to lasting (chronic) pelvic pain or the inability to have children (infertile).  °CAUSES  °The infection is often caused by the normal bacteria found in the vaginal tissues. PID may also be caused by an infection that is spread during sexual contact. PID can also occur following:  °· The birth of a baby.   °· A miscarriage.   °· An abortion.   °· Major pelvic surgery.   °· The use of an intrauterine device (IUD).   °· A sexual assault.   °RISK FACTORS °Certain factors can put a person at higher risk for PID, such as: °· Being younger than 25 years. °· Being sexually active at a young age. °· Using nonbarrier contraception. °· Having multiple sexual partners. °· Having sex with someone who has symptoms of a genital infection. °· Using oral contraception. °Other times, certain behaviors can increase the possibility of getting PID, such as: °· Having sex during your period. °· Using a vaginal douche. °· Having an intrauterine device (IUD) in place. °SYMPTOMS  °· Abdominal or pelvic pain.   °· Fever.   °· Chills.   °· Abnormal vaginal discharge. °· Abnormal uterine bleeding.   °· Unusual pain shortly after finishing your period. °DIAGNOSIS  °Your caregiver will choose some of the following methods to make a diagnosis, such as:  °· Performing a physical exam and history. A pelvic exam typically reveals a very tender uterus and surrounding pelvis.   °· Ordering laboratory tests including a pregnancy test, blood tests, and urine test.  °· Ordering cultures of the vagina and cervix to check for a sexually transmitted infection (STI). °· Performing an ultrasound.    °· Performing a laparoscopic procedure to look inside the pelvis.   °TREATMENT  °· Antibiotic medicines may be prescribed and taken by mouth.   °· Sexual partners may be treated when the infection is caused by a sexually transmitted disease (STD).   °· Hospitalization may be needed to give antibiotics intravenously. °· Surgery may be needed, but this is rare. °It may take weeks until you are completely well. If you are diagnosed with PID, you should also be checked for human immunodeficiency virus (HIV).   °HOME CARE INSTRUCTIONS  °· If given, take your antibiotics as directed. Finish the medicine even if you start to feel better.   °· Only take over-the-counter or prescription medicines for pain, discomfort, or fever as directed by your caregiver.   °· Do not have sexual intercourse until treatment is completed or as directed by your caregiver. If PID is confirmed, your recent sexual partner(s) will need treatment.   °· Keep your follow-up appointments. °SEEK MEDICAL CARE IF:  °· You have increased or abnormal vaginal discharge.   °· You need prescription medicine for your pain.   °· You vomit.   °· You cannot take your medicines.   °· Your partner has an STD.   °SEEK IMMEDIATE MEDICAL CARE IF:  °· You have a fever.   °· You have increased abdominal or pelvic pain.   °· You have chills.   °· You have pain when you urinate.   °· You are not better after 72 hours following treatment.   °MAKE SURE YOU:  °· Understand these instructions. °· Will watch your condition. °· Will get help right away if you are not doing well or get worse. °  Document Released: 01/24/2005 Document Revised: 05/21/2012 Document Reviewed: 01/20/2011 °[employer]® Patient Information ©2015 [employer]. This information is not intended to replace advice given to you by your health care provider. Make sure you discuss any questions you have with your health care provider. ° °

## 2014-04-03 LAB — GC/CHLAMYDIA PROBE AMP (~~LOC~~) NOT AT ARMC
Chlamydia: POSITIVE — AB
Neisseria Gonorrhea: NEGATIVE

## 2014-04-04 ENCOUNTER — Telehealth (HOSPITAL_BASED_OUTPATIENT_CLINIC_OR_DEPARTMENT_OTHER): Payer: Self-pay | Admitting: Emergency Medicine

## 2014-04-04 NOTE — Telephone Encounter (Signed)
Positive Chlamydia Treated with Rocephin, Zithromax and Doxycycline per protocol MD [facility] faxed  Patient contacted 04/04/14: ID verified, pt notified of positive Chlamydia and that treatment was given while in ED. STD instructions provided. pt verbalized understanding.

## 2014-07-15 ENCOUNTER — Emergency Department (HOSPITAL_COMMUNITY)
Admission: EM | Admit: 2014-07-15 | Discharge: 2014-07-15 | Disposition: A | Discharge status: Home / Self Care | Payer: Medicaid Other | Attending: Emergency Medicine | Admitting: Emergency Medicine

## 2014-07-15 ENCOUNTER — Encounter (HOSPITAL_COMMUNITY): Payer: Self-pay | Admitting: *Deleted

## 2014-07-15 DIAGNOSIS — R42 Dizziness and giddiness: Secondary | ICD-10-CM | POA: Insufficient documentation

## 2014-07-15 DIAGNOSIS — R1084 Generalized abdominal pain: Secondary | ICD-10-CM

## 2014-07-15 DIAGNOSIS — R519 Headache, unspecified: Secondary | ICD-10-CM

## 2014-07-15 DIAGNOSIS — R011 Cardiac murmur, unspecified: Secondary | ICD-10-CM | POA: Insufficient documentation

## 2014-07-15 DIAGNOSIS — R51 Headache: Secondary | ICD-10-CM | POA: Insufficient documentation

## 2014-07-15 DIAGNOSIS — Z3202 Encounter for pregnancy test, result negative: Secondary | ICD-10-CM | POA: Insufficient documentation

## 2014-07-15 DIAGNOSIS — Z792 Long term (current) use of antibiotics: Secondary | ICD-10-CM | POA: Insufficient documentation

## 2014-07-15 LAB — CBC WITH DIFFERENTIAL/PLATELET
Basophils Absolute: 0 10*3/uL (ref 0.0–0.1)
Basophils Relative: 1 % (ref 0–1)
Eosinophils Absolute: 0.1 10*3/uL (ref 0.0–0.7)
Eosinophils Relative: 3 % (ref 0–5)
HCT: 37.3 % (ref 36.0–46.0)
HEMOGLOBIN: 12.7 g/dL (ref 12.0–15.0)
LYMPHS ABS: 2.2 10*3/uL (ref 0.7–4.0)
Lymphocytes Relative: 53 % — ABNORMAL HIGH (ref 12–46)
MCH: 31.3 pg (ref 26.0–34.0)
MCHC: 34 g/dL (ref 30.0–36.0)
MCV: 91.9 fL (ref 78.0–100.0)
Monocytes Absolute: 0.3 10*3/uL (ref 0.1–1.0)
Monocytes Relative: 6 % (ref 3–12)
NEUTROS ABS: 1.5 10*3/uL — AB (ref 1.7–7.7)
Neutrophils Relative %: 37 % — ABNORMAL LOW (ref 43–77)
Platelets: 259 10*3/uL (ref 150–400)
RBC: 4.06 MIL/uL (ref 3.87–5.11)
RDW: 12.1 % (ref 11.5–15.5)
WBC: 4.2 10*3/uL (ref 4.0–10.5)

## 2014-07-15 LAB — COMPREHENSIVE METABOLIC PANEL
ALK PHOS: 46 U/L (ref 38–126)
ALT: 15 U/L (ref 14–54)
AST: 18 U/L (ref 15–41)
Albumin: 4.1 g/dL (ref 3.5–5.0)
Anion gap: 7 (ref 5–15)
BUN: 10 mg/dL (ref 6–20)
CHLORIDE: 105 mmol/L (ref 101–111)
CO2: 27 mmol/L (ref 22–32)
Calcium: 9.1 mg/dL (ref 8.9–10.3)
Creatinine, Ser: 0.76 mg/dL (ref 0.44–1.00)
GFR calc Af Amer: >60 mL/min (ref >60)
GFR calc non Af Amer: >60 mL/min (ref >60)
Glucose, Bld: 111 mg/dL — ABNORMAL HIGH (ref 65–99)
Potassium: 4 mmol/L (ref 3.5–5.1)
SODIUM: 139 mmol/L (ref 135–145)
Total Bilirubin: 0.7 mg/dL (ref 0.3–1.2)
Total Protein: 7.4 g/dL (ref 6.5–8.1)

## 2014-07-15 LAB — URINE MICROSCOPIC-ADD ON

## 2014-07-15 LAB — LIPASE, BLOOD: Lipase: 26 U/L (ref 22–51)

## 2014-07-15 LAB — URINALYSIS, ROUTINE W REFLEX MICROSCOPIC
Bilirubin Urine: NEGATIVE
Glucose, UA: NEGATIVE mg/dL
Hgb urine dipstick: NEGATIVE
KETONES UR: NEGATIVE mg/dL
Nitrite: NEGATIVE
PROTEIN: NEGATIVE mg/dL
Specific Gravity, Urine: 1.021 (ref 1.005–1.030)
Urobilinogen, UA: 0.2 mg/dL (ref 0.0–1.0)
pH: 7 (ref 5.0–8.0)

## 2014-07-15 LAB — PREGNANCY, URINE: Preg Test, Ur: NEGATIVE

## 2014-07-15 MED ORDER — OXYCODONE-ACETAMINOPHEN 5-325 MG PO TABS
1.0000 | ORAL_TABLET | Freq: Once | ORAL | Status: AC
  Administered 2014-07-15: 1 via ORAL
  Filled 2014-07-15: qty 1

## 2014-07-15 MED ORDER — ASPIRIN-ACETAMINOPHEN-CAFFEINE 250-250-65 MG PO TABS
1.0000 | ORAL_TABLET | Freq: Four times a day (QID) | ORAL | Status: DC | PRN
Start: 2014-07-15 — End: 2018-01-01

## 2014-07-15 MED ORDER — SODIUM CHLORIDE 0.9 % IV BOLUS (SEPSIS)
1000.0000 mL | Freq: Once | INTRAVENOUS | Status: AC
  Administered 2014-07-15: 1000 mL via INTRAVENOUS

## 2014-07-15 MED ORDER — KETOROLAC TROMETHAMINE 30 MG/ML IJ SOLN
30.0000 mg | Freq: Once | INTRAMUSCULAR | Status: AC
  Administered 2014-07-15: 30 mg via INTRAVENOUS
  Filled 2014-07-15: qty 1

## 2014-07-15 MED ORDER — METOCLOPRAMIDE HCL 5 MG/ML IJ SOLN
10.0000 mg | Freq: Once | INTRAMUSCULAR | Status: AC
  Administered 2014-07-15: 10 mg via INTRAVENOUS
  Filled 2014-07-15: qty 2

## 2014-07-15 MED ORDER — DEXAMETHASONE SODIUM PHOSPHATE 10 MG/ML IJ SOLN
10.0000 mg | Freq: Once | INTRAMUSCULAR | Status: AC
  Administered 2014-07-15: 10 mg via INTRAVENOUS
  Filled 2014-07-15: qty 1

## 2014-07-15 MED ORDER — DIPHENHYDRAMINE HCL 50 MG/ML IJ SOLN
25.0000 mg | Freq: Once | INTRAMUSCULAR | Status: AC
  Administered 2014-07-15: 25 mg via INTRAVENOUS
  Filled 2014-07-15: qty 1

## 2014-07-15 MED ORDER — ACETAMINOPHEN 500 MG PO TABS: 1000.0000 mg | ORAL_TABLET | Freq: Once | ORAL | Status: DC

## 2014-07-15 NOTE — ED Notes (Signed)
Pt given ginger ale and saltine crackers.  
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
## 2014-07-15 NOTE — Discharge Instructions (Signed)

## 2014-07-15 NOTE — ED Notes (Signed)
Pt ambulating independently w/ steady gait on d/c in no acute distress, A&Ox4. D/c instructions reviewed w/ pt and family - pt and family deny any further questions or concerns at present. Rx given x1  
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
## 2015-01-27 ENCOUNTER — Emergency Department (HOSPITAL_COMMUNITY)
Admission: EM | Admit: 2015-01-27 | Discharge: 2015-01-27 | Disposition: A | Discharge status: Home / Self Care | Payer: Medicaid Other | Attending: Emergency Medicine | Admitting: Emergency Medicine

## 2015-01-27 ENCOUNTER — Encounter (HOSPITAL_COMMUNITY): Payer: Self-pay | Admitting: *Deleted

## 2015-01-27 DIAGNOSIS — J029 Acute pharyngitis, unspecified: Secondary | ICD-10-CM

## 2015-01-27 DIAGNOSIS — H9209 Otalgia, unspecified ear: Secondary | ICD-10-CM | POA: Insufficient documentation

## 2015-01-27 DIAGNOSIS — G43909 Migraine, unspecified, not intractable, without status migrainosus: Secondary | ICD-10-CM | POA: Insufficient documentation

## 2015-01-27 DIAGNOSIS — R011 Cardiac murmur, unspecified: Secondary | ICD-10-CM | POA: Insufficient documentation

## 2015-01-27 DIAGNOSIS — J069 Acute upper respiratory infection, unspecified: Secondary | ICD-10-CM | POA: Insufficient documentation

## 2015-01-27 LAB — RAPID STREP SCREEN (MED CTR MEBANE ONLY): Streptococcus, Group A Screen (Direct): NEGATIVE

## 2015-01-27 NOTE — Discharge Instructions (Signed)
Pharyngitis °Pharyngitis is redness, pain, and swelling (inflammation) of your pharynx.  °CAUSES  °Pharyngitis is usually caused by infection. Most of the time, these infections are from viruses (viral) and are part of a cold. However, sometimes pharyngitis is caused by bacteria (bacterial). Pharyngitis can also be caused by allergies. Viral pharyngitis may be spread from person to person by coughing, sneezing, and personal items or utensils (cups, forks, spoons, toothbrushes). Bacterial pharyngitis may be spread from person to person by more intimate contact, such as kissing.  °SIGNS AND SYMPTOMS  °Symptoms of pharyngitis include:   °· Sore throat.   °· Tiredness (fatigue).   °· Low-grade fever.   °· Headache. °· Joint pain and muscle aches. °· Skin rashes. °· Swollen lymph nodes. °· Plaque-like film on throat or tonsils (often seen with bacterial pharyngitis). °DIAGNOSIS  °Your health care provider will ask you questions about your illness and your symptoms. Your medical history, along with a physical exam, is often all that is needed to diagnose pharyngitis. Sometimes, a rapid strep test is done. Other lab tests may also be done, depending on the suspected cause.  °TREATMENT  °Viral pharyngitis will usually get better in 3-4 days without the use of medicine. Bacterial pharyngitis is treated with medicines that kill germs (antibiotics).  °HOME CARE INSTRUCTIONS  °· Drink enough water and fluids to keep your urine clear or pale yellow.   °· Only take over-the-counter or prescription medicines as directed by your health care provider:   °· If you are prescribed antibiotics, make sure you finish them even if you start to feel better.   °· Do not take aspirin.   °· Get lots of rest.   °· Gargle with 8 oz of salt water (½ tsp of salt per 1 qt of water) as often as every 1-2 hours to soothe your throat.   °· Throat lozenges (if you are not at risk for choking) or sprays may be used to soothe your throat. °SEEK MEDICAL  CARE IF:  °· You have large, tender lumps in your neck. °· You have a rash. °· You cough up green, yellow-brown, or bloody spit. °SEEK IMMEDIATE MEDICAL CARE IF:  °· Your neck becomes stiff. °· You drool or are unable to swallow liquids. °· You vomit or are unable to keep medicines or liquids down. °· You have severe pain that does not go away with the use of recommended medicines. °· You have trouble breathing (not caused by a stuffy nose). °MAKE SURE YOU:  °· Understand these instructions. °· Will watch your condition. °· Will get help right away if you are not doing well or get worse. °  °This information is not intended to replace advice given to you by your health care provider. Make sure you discuss any questions you have with your health care provider. °  °Document Released: 01/24/2005 Document Revised: 11/14/2012 Document Reviewed: 10/01/2012 °Elsevier Interactive Patient Education ©2016 Elsevier Inc[employer] °Upper Respiratory Infection, Adult °Most upper respiratory infections (URIs) are a viral infection of the air passages leading to the lungs. A URI affects the nose, throat, and upper air passages. The most common type of URI is nasopharyngitis and is typically referred to as "the common cold." °URIs run their course and usually go away on their own. Most of the time, a URI does not require medical attention, but sometimes a bacterial infection in the upper airways can follow a viral infection. This is called a secondary infection. Sinus and middle ear infections are common types of secondary upper respiratory infections. °Bacterial pneumonia   can also complicate a URI. A URI can worsen asthma and chronic obstructive pulmonary disease (COPD). Sometimes, these complications can require emergency medical care and may be life threatening.  °CAUSES °Almost all URIs are caused by viruses. A virus is a type of germ and can spread from one person to another.  °RISKS FACTORS °You may be at risk for a URI if:  °· You  smoke.   °· You have chronic heart or lung disease. °· You have a weakened defense (immune) system.   °· You are very young or very old.   °· You have nasal allergies or asthma. °· You work in crowded or poorly ventilated areas. °· You work in health care facilities or schools. °SIGNS AND SYMPTOMS  °Symptoms typically develop 2-3 days after you come in contact with a cold virus. Most viral URIs last 7-10 days. However, viral URIs from the influenza virus (flu virus) can last 14-18 days and are typically more severe. Symptoms may include:  °· Runny or stuffy (congested) nose.   °· Sneezing.   °· Cough.   °· Sore throat.   °· Headache.   °· Fatigue.   °· Fever.   °· Loss of appetite.   °· Pain in your forehead, behind your eyes, and over your cheekbones (sinus pain). °· Muscle aches.   °DIAGNOSIS  °Your health care provider may diagnose a URI by: °· Physical exam. °· Tests to check that your symptoms are not due to another condition such as: °¨ Strep throat. °¨ Sinusitis. °¨ Pneumonia. °¨ Asthma. °TREATMENT  °A URI goes away on its own with time. It cannot be cured with medicines, but medicines may be prescribed or recommended to relieve symptoms. Medicines may help: °· Reduce your fever. °· Reduce your cough. °· Relieve nasal congestion. °HOME CARE INSTRUCTIONS  °· Take medicines only as directed by your health care provider.   °· Gargle warm saltwater or take cough drops to comfort your throat as directed by your health care provider. °· Use a warm mist humidifier or inhale steam from a shower to increase air moisture. This may make it easier to breathe. °· Drink enough fluid to keep your urine clear or pale yellow.   °· Eat soups and other clear broths and maintain good nutrition.   °· Rest as needed.   °· Return to work when your temperature has returned to normal or as your health care provider advises. You may need to stay home longer to avoid infecting others. You can also use a face mask and careful hand  washing to prevent spread of the virus. °· Increase the usage of your inhaler if you have asthma.   °· Do not use any tobacco products, including cigarettes, chewing tobacco, or electronic cigarettes. If you need help quitting, ask your health care provider. °PREVENTION  °The best way to protect yourself from getting a cold is to practice good hygiene.  °· Avoid oral or hand contact with people with cold symptoms.   °· Wash your hands often if contact occurs.   °There is no clear evidence that vitamin C, vitamin E, echinacea, or exercise reduces the chance of developing a cold. However, it is always recommended to get plenty of rest, exercise, and practice good nutrition.  °SEEK MEDICAL CARE IF:  °· You are getting worse rather than better.   °· Your symptoms are not controlled by medicine.   °· You have chills. °· You have worsening shortness of breath. °· You have brown or red mucus. °· You have yellow or brown nasal discharge. °· You have pain in your face, especially   when you bend forward. °· You have a fever. °· You have swollen neck glands. °· You have pain while swallowing. °· You have white areas in the back of your throat. °SEEK IMMEDIATE MEDICAL CARE IF:  °· You have severe or persistent: °¨ Headache. °¨ Ear pain. °¨ Sinus pain. °¨ Chest pain. °· You have chronic lung disease and any of the following: °¨ Wheezing. °¨ Prolonged cough. °¨ Coughing up blood. °¨ A change in your usual mucus. °· You have a stiff neck. °· You have changes in your: °¨ Vision. °¨ Hearing. °¨ Thinking. °¨ Mood. °MAKE SURE YOU:  °· Understand these instructions. °· Will watch your condition. °· Will get help right away if you are not doing well or get worse. °  °This information is not intended to replace advice given to you by your health care provider. Make sure you discuss any questions you have with your health care provider. °  °Document Released: 07/20/2000 Document Revised: 06/10/2014 Document Reviewed: 05/01/2013 °Elsevier  Interactive Patient Education ©2016 Elsevier Inc. ° °

## 2015-01-27 NOTE — ED Provider Notes (Signed)
CSN: [id number]     Arrival date & time 01/27/15  1331 History  By signing my name below, I, Jaime Shelton, attest that this documentation has been prepared under the direction and in the presence of Jaime Heimlich, PA-C Electronically Signed: Jarvis Shelton, ED Scribe. 01/27/2015. 5:22 PM.    Chief Complaint  Patient presents with  . Sore Throat   The history is provided by the patient. No language interpreter was used.    HPI Comments: Jaime Shelton is a 24 y.o. female who presents to the Emergency Department complaining of gradual onset, constant, moderate, 8/10, aching, sore throat that began yesterday. The throat pain is midline and worsened by swallowing. The pain is worse in the morning and gradually improves during the day. She has tried hot tea with mild relief. She reports associated non-productive cough, otalgia, congestion and rhinorrhea. Pt has not taken any medications prior to arrival. Pt denies any known sick contacts with similar symptoms. She denies any fevers, or other associated symptoms. Pt also notes that she has had a chronic headache issues since 2011. She was instructed to follow up with her PCP and neurology at her last ED visit for headache but has yet to schedule appointments. Denies changes in her headaches.   Past Medical History  Diagnosis Date  . Heart murmur   . Complication of anesthesia   . PONV (postoperative nausea and vomiting)   . Shortness of breath     Hx: of  . Headache(784.0)     Hx: of Migraines  . Abdominal pain     Hx: of   Past Surgical History  Procedure Laterality Date  . No past surgeries    . Ear cyst excision Left 06/26/2012    Procedure: excison of left axillary CYST REMOVAL;  Surgeon: Lodema Pilot, DO;  Location: [facility] OR;  Service: General;  Laterality: Left;  . Axillary surgery  06/26/2012    cyst removed   Family History  Problem Relation Age of Onset  . HIV Father   . Hypertension Mother    Social History  Substance Use  Topics  . Smoking status: Never Smoker   . Smokeless tobacco: Never Used  . Alcohol Use: Yes     Comment: occasional   OB History    No data available     Review of Systems  Constitutional: Negative for fever and chills.  HENT: Positive for congestion, ear pain, rhinorrhea and sore throat. Negative for ear discharge and trouble swallowing.   Eyes: Negative for discharge and redness.  Respiratory: Positive for cough. Negative for shortness of breath and wheezing.   Cardiovascular: Negative for chest pain.  Gastrointestinal: Negative for nausea, vomiting and abdominal pain.  Musculoskeletal: Negative for neck pain and neck stiffness.  Neurological: Positive for headaches.  All other systems reviewed and are negative.     Allergies  Review of patient's allergies indicates no known allergies.  Home Medications   Prior to Admission medications   Medication Sig Start Date End Date Taking? Authorizing Provider  ibuprofen (ADVIL,MOTRIN) 200 MG tablet Take 400 mg by mouth every 6 (six) hours as needed (pain).   Yes Historical Provider, MD  aspirin-acetaminophen-caffeine (EXCEDRIN MIGRAINE) [phone] MG per tablet Take 1 tablet by mouth every 6 (six) hours as needed for headache. Patient not taking: Reported on 01/27/2015 07/15/14   Junius Finner, PA-C  doxycycline (VIBRA-TABS) 100 MG tablet Take 1 tablet (100 mg total) by mouth 2 (two) times daily. Patient not taking: Reported on 07/15/2014  04/02/14   Elwin MochaBlair Walden, MD  oxyCODONE-acetaminophen (PERCOCET/ROXICET) 5-325 MG per tablet Take 1 tablet by mouth every 6 (six) hours as needed for severe pain. Patient not taking: Reported on 07/15/2014 04/02/14   Elwin MochaBlair Walden, MD   Triage Vitals: BP 107/75 mmHg  Pulse 93  Temp(Src) 98.5 F (36.9 C) (Oral)  Resp 13  SpO2 99%  LMP 10/13/2014 (Approximate)  Physical Exam  Constitutional: She appears well-developed and well-nourished. No distress.  Nontoxic appearing  HENT:  Head: Normocephalic  and atraumatic.  Right Ear: Tympanic membrane, external ear and ear canal normal.  Left Ear: Tympanic membrane, external ear and ear canal normal.  Nose: Mucosal edema present.  Mouth/Throat: Uvula is midline and mucous membranes are normal. Mucous membranes are not dry. No trismus in the jaw. No uvula swelling. Posterior oropharyngeal erythema present. No oropharyngeal exudate, posterior oropharyngeal edema or tonsillar abscesses.  Eyes: Conjunctivae and EOM are normal. Right eye exhibits no discharge. Left eye exhibits no discharge. No scleral icterus.  Neck: Normal range of motion. Neck supple.  Cardiovascular: Normal rate, regular rhythm and normal heart sounds.   Pulmonary/Chest: Effort normal and breath sounds normal. No respiratory distress. She has no wheezes. She has no rales.  Musculoskeletal: Normal range of motion. She exhibits no edema or tenderness.  Moves all extremities spontaneously and walks with a steady gait  Lymphadenopathy:    She has no cervical adenopathy.  Neurological: She is alert. No cranial nerve deficit. Coordination normal.  Skin: Skin is warm and dry.  Psychiatric: She has a normal mood and affect. Her behavior is normal.  Nursing note and vitals reviewed.   ED Course  Procedures (including critical care time)  DIAGNOSTIC STUDIES: Oxygen Saturation is 99% on RA, normal by my interpretation.    COORDINATION OF CARE: 1:55 PM- Will order rapid strep screen. Pt advised of plan for treatment and pt agrees.  Labs Review Labs Reviewed  RAPID STREP SCREEN (NOT AT [facility])  CULTURE, GROUP A STREP    Imaging Review No results found. I have personally reviewed and evaluated these lab results as part of my medical decision-making.   EKG Interpretation None      MDM   Final diagnoses:  URI (upper respiratory infection)  Viral pharyngitis   Pt presenting with sore throat, odynophagia, congestion, rhinorrhea and dry cough. Pt has not tried any treatments  PTA. VSS.  Nasal musosal edema noted. TMs pearly gray without erythema or effusion.Oropharyngeal erythema without exudate. No uvular edema or deviation. No trismus. No cervical lymphadenopathy. Lungs CTAB. Negative strep. Likely viral URI. No abx indicated. DC w symptomatic tx for pain to include OTC pain relievers (tylenol, motrin) and chloraseptic spray.  Pt does not appear dehydrated, but did discuss importance of water rehydration. Presentation not concerning for PTA or infxn spread to soft tissues of the neck. Return precautions given in discharge paperwork and discussed with pt at bedside. Pt stable for discharge  I personally performed the services described in this documentation, which was scribed in my presence. The recorded information has been reviewed and is accurate.     Jaime HeimlichStevi Dotsie Gillette, PA-C 01/27/15 1722  Arby BarretteMarcy Pfeiffer, MD 02/04/15 [phone]

## 2015-01-27 NOTE — Progress Notes (Signed)
Entered in d/c instructions Please use the resources provided to you in emergency room by case manager to assist with doctor for follow up On 01/30/2015 A referral for you has been sent to [facility] if you have not received a call in 3 days you may contact them Call Scherry RanKaren Andrianos at [phone] Tuesday-Friday [email or web address] These [place] resources provide possible primary care providers, resources for discounted medications, housing, dental resources, affordable care act information, plus other resources for [facility]

## 2015-01-27 NOTE — ED Notes (Signed)
Pt reports sore throat since yesterday 12/19. Pain 8/10.  Non productive cough.   Pt also reports chronic headache/pain for several months. Pt was encouraged to follow up with pcp for chronic issues. Pt confirmed acute issue today is throat pain.

## 2015-01-27 NOTE — Progress Notes (Signed)
Pt no longer see Britt BottomAlvin blount Removed from EPIC   CM spoke with pt who [place]cp.  CM discussed and provided written information for uninsured accepting pcps, discussed the importance of pcp vs EDP services for f/u care, www.needymeds.org, www.goodrx.com, discounted pharmacies and other [place] resources such as [facility] , [facility], affordable care act, financial assistance, uninsured dental services, [place] med assist, DSS and  health department  Reviewed resources for [place] uninsured accepting pcps like [facility], family medicine [facility] [address], [facility], [facility], local urgent care centers, [facility], [facility], general medical clinics, [facility], [facility] plus others, medication resources, [facility] out patient [facility]ies and housing Pt voiced understanding and appreciation of resources provided   Provided [facility] contact information Pt agreed to a referral Cm completed referral Pt to be contact by [facility] clinical liason

## 2015-01-29 LAB — CULTURE, GROUP A STREP: STREP A CULTURE: NEGATIVE

## 2015-03-07 ENCOUNTER — Encounter (HOSPITAL_COMMUNITY): Payer: Self-pay | Admitting: *Deleted

## 2015-03-07 ENCOUNTER — Emergency Department (HOSPITAL_COMMUNITY)
Admission: EM | Admit: 2015-03-07 | Discharge: 2015-03-07 | Disposition: A | Discharge status: Home / Self Care | Payer: Medicaid Other | Attending: Emergency Medicine | Admitting: Emergency Medicine

## 2015-03-07 DIAGNOSIS — K6289 Other specified diseases of anus and rectum: Secondary | ICD-10-CM | POA: Insufficient documentation

## 2015-03-07 DIAGNOSIS — Z8679 Personal history of other diseases of the circulatory system: Secondary | ICD-10-CM | POA: Insufficient documentation

## 2015-03-07 DIAGNOSIS — R011 Cardiac murmur, unspecified: Secondary | ICD-10-CM | POA: Insufficient documentation

## 2015-03-07 DIAGNOSIS — Z3202 Encounter for pregnancy test, result negative: Secondary | ICD-10-CM | POA: Insufficient documentation

## 2015-03-07 LAB — POC OCCULT BLOOD, ED: Fecal Occult Bld: NEGATIVE

## 2015-03-07 LAB — POC URINE PREG, ED: Preg Test, Ur: NEGATIVE

## 2015-03-07 MED ORDER — LIDOCAINE HCL 2 % EX GEL
1.0000 "application " | Freq: Once | CUTANEOUS | Status: AC
  Administered 2015-03-07: 1 via TOPICAL
  Filled 2015-03-07: qty 20

## 2015-03-07 MED ORDER — ACETAMINOPHEN 325 MG PO TABS
650.0000 mg | ORAL_TABLET | Freq: Once | ORAL | Status: AC
  Administered 2015-03-07: 650 mg via ORAL
  Filled 2015-03-07: qty 2

## 2015-03-07 MED ORDER — LIDOCAINE HCL 4 % EX SOLN
Freq: Once | CUTANEOUS | Status: DC
  Filled 2015-03-07: qty 50

## 2015-03-07 NOTE — ED Provider Notes (Signed)
CSN: [id number]     Arrival date & time 03/07/15  1324 History   First MD Initiated Contact with Patient 03/07/15 [phone]     No chief complaint on file.    (Consider location/radiation/quality/duration/timing/severity/associated sxs/prior Treatment) HPI Complaints of pain at anus for 1 week pain is worse with bowel movements or with lying on the area. Not improved by anything. No treatment prior to coming here. Nonradiating. No treatment prior to coming here. No trauma. No fever. She does admit to yellowish discharge from her rectum a few days ago. She also admits to intermittent abdominal pain over the past week however none presently. Last normal menstrual period 2 months ago. She does have intermittent spotting and has not had regular menses for several months. No other associated symptoms Past Medical History  Diagnosis Date  . Heart murmur   . Complication of anesthesia   . PONV (postoperative nausea and vomiting)   . Shortness of breath     Hx: of  . Headache(784.0)     Hx: of Migraines  . Abdominal pain     Hx: of   Past Surgical History  Procedure Laterality Date  . No past surgeries    . Ear cyst excision Left 06/26/2012    Procedure: excison of left axillary CYST REMOVAL;  Surgeon: Lodema Pilot, DO;  Location: [facility] OR;  Service: General;  Laterality: Left;  . Axillary surgery  06/26/2012    cyst removed   Family History  Problem Relation Age of Onset  . HIV Father   . Hypertension Mother    Social History  Substance Use Topics  . Smoking status: Never Smoker   . Smokeless tobacco: Never Used  . Alcohol Use: Yes     Comment: occasional   OB History    No data available     Review of Systems  Constitutional: Negative.   HENT: Negative.   Respiratory: Negative.   Cardiovascular: Negative.   Gastrointestinal: Positive for abdominal pain.       Anal pain  Genitourinary:       Irregular menses  Musculoskeletal: Negative.   Skin: Negative.   Neurological:  Negative.   Psychiatric/Behavioral: Negative.   All other systems reviewed and are negative.     Allergies  Review of patient's allergies indicates no known allergies.  Home Medications   Prior to Admission medications   Medication Sig Start Date End Date Taking? Authorizing Provider  aspirin-acetaminophen-caffeine (EXCEDRIN MIGRAINE) [phone] MG per tablet Take 1 tablet by mouth every 6 (six) hours as needed for headache. Patient not taking: Reported on 01/27/2015 07/15/14   Junius Finner, PA-C  doxycycline (VIBRA-TABS) 100 MG tablet Take 1 tablet (100 mg total) by mouth 2 (two) times daily. Patient not taking: Reported on 07/15/2014 04/02/14   Elwin Mocha, MD  ibuprofen (ADVIL,MOTRIN) 200 MG tablet Take 400 mg by mouth every 6 (six) hours as needed (pain).    Historical Provider, MD  oxyCODONE-acetaminophen (PERCOCET/ROXICET) 5-325 MG per tablet Take 1 tablet by mouth every 6 (six) hours as needed for severe pain. Patient not taking: Reported on 07/15/2014 04/02/14   Elwin Mocha, MD   There were no vitals taken for this visit. Physical Exam  Constitutional: She appears well-developed and well-nourished.  HENT:  Head: Normocephalic and atraumatic.  Eyes: Conjunctivae are normal. Pupils are equal, round, and reactive to light.  Neck: Neck supple. No tracheal deviation present. No thyromegaly present.  Cardiovascular: Normal rate and regular rhythm.   No murmur heard. Heart  rate counted 64 by me  Pulmonary/Chest: Effort normal and breath sounds normal.  Abdominal: Soft. Bowel sounds are normal. She exhibits no distension. There is no tenderness.  Genitourinary:  RectumNormal tone, no fluctuance no swelling. Mildly tender on digital rectal exam was superficially. No discharge no gross blood  Musculoskeletal: Normal range of motion. She exhibits no edema or tenderness.  Neurological: She is alert. Coordination normal.  Skin: Skin is warm and dry. No rash noted.  Psychiatric: She has a  normal mood and affect.  Nursing note and vitals reviewed.   ED Course  Procedures (including critical care time) Labs Review Labs Reviewed - No data to display  Imaging Review No results found. I have personally reviewed and evaluated these images and lab results as part of my medical decision-making.   EKG Interpretation None     Patient reports no relief after treatment with Tylenol and with topical lidocaine. Results for orders placed or performed during the hospital encounter of 03/07/15  POC urine preg, ED (not at [facility])  Result Value Ref Range   Preg Test, Ur NEGATIVE NEGATIVE  POC occult blood, ED Provider will collect  Result Value Ref Range   Fecal Occult Bld NEGATIVE NEGATIVE   No results found.  MDM  Suggest warm soaks., Sitz baths. Follow-up with PMD if not better by next week. I don't see any indication for imaging. Normal rectal exam. No fluctuance. Patient not ill-appearing Final diagnoses:  None   Dx anal pain     Doug Sou, MD 03/07/15 1150

## 2015-03-07 NOTE — ED Notes (Signed)
Pt states pain in rectum 8/10 constant.  Pt states yellow discharge from rectum since last sun.  Pt denies any other medical problems at this time.

## 2015-03-07 NOTE — Discharge Instructions (Signed)
Sit in warm bathtub 4 times daily for 30 minutes of time. See your gynecologist not improving in 4 or 5 days. Take Tylenol as directed for pain.

## 2016-11-06 ENCOUNTER — Encounter (HOSPITAL_COMMUNITY): Payer: Self-pay | Admitting: Emergency Medicine

## 2016-11-06 ENCOUNTER — Emergency Department (HOSPITAL_COMMUNITY)
Admission: EM | Admit: 2016-11-06 | Discharge: 2016-11-06 | Disposition: A | Discharge status: Home / Self Care | Payer: Medicaid Other | Attending: Emergency Medicine | Admitting: Emergency Medicine

## 2016-11-06 DIAGNOSIS — Z5321 Procedure and treatment not carried out due to patient leaving prior to being seen by health care provider: Secondary | ICD-10-CM | POA: Insufficient documentation

## 2016-11-06 DIAGNOSIS — R1110 Vomiting, unspecified: Secondary | ICD-10-CM | POA: Diagnosis present

## 2016-11-06 LAB — COMPREHENSIVE METABOLIC PANEL
ALT: 17 U/L (ref 14–54)
AST: 24 U/L (ref 15–41)
Albumin: 4.6 g/dL (ref 3.5–5.0)
Alkaline Phosphatase: 42 U/L (ref 38–126)
Anion gap: 9 (ref 5–15)
BUN: 10 mg/dL (ref 6–20)
CALCIUM: 9.2 mg/dL (ref 8.9–10.3)
CO2: 24 mmol/L (ref 22–32)
CREATININE: 0.63 mg/dL (ref 0.44–1.00)
Chloride: 108 mmol/L (ref 101–111)
GFR calc non Af Amer: >60 mL/min (ref >60)
GLUCOSE: 97 mg/dL (ref 65–99)
Potassium: 3.8 mmol/L (ref 3.5–5.1)
Sodium: 141 mmol/L (ref 135–145)
Total Bilirubin: 0.5 mg/dL (ref 0.3–1.2)
Total Protein: 7.7 g/dL (ref 6.5–8.1)

## 2016-11-06 LAB — URINALYSIS, ROUTINE W REFLEX MICROSCOPIC
Bacteria, UA: NONE SEEN
Bilirubin Urine: NEGATIVE
GLUCOSE, UA: NEGATIVE mg/dL
Hgb urine dipstick: NEGATIVE
Ketones, ur: 80 mg/dL — AB
Nitrite: NEGATIVE
Protein, ur: 30 mg/dL — AB
Specific Gravity, Urine: 1.023 (ref 1.005–1.030)
pH: 6 (ref 5.0–8.0)

## 2016-11-06 LAB — LIPASE, BLOOD: Lipase: 23 U/L (ref 11–51)

## 2016-11-06 LAB — POC URINE PREG, ED: Preg Test, Ur: NEGATIVE

## 2016-11-06 LAB — CBC
HCT: 36 % (ref 36.0–46.0)
Hemoglobin: 12.6 g/dL (ref 12.0–15.0)
MCH: 31.5 pg (ref 26.0–34.0)
MCHC: 35 g/dL (ref 30.0–36.0)
MCV: 90 fL (ref 78.0–100.0)
Platelets: 272 10*3/uL (ref 150–400)
RBC: 4 MIL/uL (ref 3.87–5.11)
RDW: 12.6 % (ref 11.5–15.5)
WBC: 7.4 10*3/uL (ref 4.0–10.5)

## 2016-11-06 NOTE — ED Notes (Signed)
Called from the waiting room, no response

## 2016-11-06 NOTE — ED Notes (Signed)
Called x3 to be placed in treatment room with no response.

## 2016-11-06 NOTE — ED Triage Notes (Signed)
Patient c/o generalized abdominal pain with N/V/D and chills since this morning.

## 2016-12-15 ENCOUNTER — Ambulatory Visit (HOSPITAL_COMMUNITY)
Admission: EM | Admit: 2016-12-15 | Discharge: 2016-12-15 | Disposition: A | Discharge status: Home / Self Care | Payer: Medicaid Other | Attending: Family Medicine | Admitting: Family Medicine

## 2016-12-15 ENCOUNTER — Encounter (HOSPITAL_COMMUNITY): Payer: Self-pay | Admitting: Family Medicine

## 2016-12-15 DIAGNOSIS — R519 Headache, unspecified: Secondary | ICD-10-CM

## 2016-12-15 DIAGNOSIS — R51 Headache: Secondary | ICD-10-CM

## 2016-12-15 MED ORDER — METOCLOPRAMIDE HCL 5 MG/ML IJ SOLN
INTRAMUSCULAR | Status: AC
  Filled 2016-12-15: qty 2

## 2016-12-15 MED ORDER — METOCLOPRAMIDE HCL 5 MG/ML IJ SOLN
5.0000 mg | Freq: Once | INTRAMUSCULAR | Status: AC
  Administered 2016-12-15: 5 mg via INTRAMUSCULAR

## 2016-12-15 MED ORDER — DIPHENHYDRAMINE HCL 50 MG/ML IJ SOLN
25.0000 mg | Freq: Once | INTRAMUSCULAR | Status: AC
  Administered 2016-12-15: 25 mg via INTRAMUSCULAR

## 2016-12-15 MED ORDER — KETOROLAC TROMETHAMINE 30 MG/ML IJ SOLN
30.0000 mg | Freq: Once | INTRAMUSCULAR | Status: AC
  Administered 2016-12-15: 30 mg via INTRAMUSCULAR

## 2016-12-15 MED ORDER — KETOROLAC TROMETHAMINE 30 MG/ML IJ SOLN
INTRAMUSCULAR | Status: AC
  Filled 2016-12-15: qty 1

## 2016-12-15 MED ORDER — DIPHENHYDRAMINE HCL 50 MG/ML IJ SOLN
INTRAMUSCULAR | Status: AC
  Filled 2016-12-15: qty 1

## 2016-12-15 NOTE — ED Provider Notes (Addendum)
[facility]    CSN: [id number] Arrival date & time: 12/15/16  1316     History   Chief Complaint Chief Complaint  Patient presents with  . Headache    HPI Barbarann EhlersOlivia Suits is a 26 y.o. female.   Zollie ScaleOlivia presents with her friend with complaints of headache which has been present since 0600 today. She has had nausea and vomiting. Two episodes of vomiting. Tried taking aleve but vomited it. Mild dizziness, movement worsens. Light worsens head pain. Sound does not effect it. Rates pain 10/10. Denies vision change. States has had similar headaches in the past, this feels similar but has lasted longer than typical for her. She has been drinking fluids. Pain is behind her eyes and top of head. States it is sharp. Per chart has a history of migraines, last seen in 2016 for this. Denies head injury.   ROS per HPI.       Past Medical History:  Diagnosis Date  . Abdominal pain    Hx: of  . Complication of anesthesia   . Headache(784.0)    Hx: of Migraines  . Heart murmur   . PONV (postoperative nausea and vomiting)   . Shortness of breath    Hx: of    There are no active problems to display for this patient.   Past Surgical History:  Procedure Laterality Date  . AXILLARY SURGERY  06/26/2012   cyst removed  . NO PAST SURGERIES      OB History    No data available       Home Medications    Prior to Admission medications   Medication Sig Start Date End Date Taking? Authorizing Provider  aspirin-acetaminophen-caffeine (EXCEDRIN MIGRAINE) [phone] MG per tablet Take 1 tablet by mouth every 6 (six) hours as needed for headache. Patient not taking: Reported on 01/27/2015 07/15/14   Lurene ShadowPhelps, Erin O, PA-C  doxycycline (VIBRA-TABS) 100 MG tablet Take 1 tablet (100 mg total) by mouth 2 (two) times daily. Patient not taking: Reported on 07/15/2014 04/02/14   Elwin MochaWalden, Blair, MD  ibuprofen (ADVIL,MOTRIN) 200 MG tablet Take 400 mg by mouth every 6 (six) hours as needed  (pain).    [provider]  oxyCODONE-acetaminophen (PERCOCET/ROXICET) 5-325 MG per tablet Take 1 tablet by mouth every 6 (six) hours as needed for severe pain. Patient not taking: Reported on 07/15/2014 04/02/14   Elwin MochaWalden, Blair, MD    Family History Family History  Problem Relation Age of Onset  . HIV Father   . Hypertension Mother     Social History Social History   Tobacco Use  . Smoking status: Never Smoker  . Smokeless tobacco: Never Used  Substance Use Topics  . Alcohol use: Yes    Comment: occasional  . Drug use: No     Allergies   Patient has no known allergies.   Review of Systems Review of Systems   Physical Exam Triage Vital Signs ED Triage Vitals [12/15/16 1334]  Enc Vitals Group     BP (!) 144/94     Pulse Rate 76     Resp 18     Temp 98.6 F (37 C)     Temp src      SpO2 98 %     Weight      Height      Head Circumference      Peak Flow      Pain Score 10     Pain Loc  Pain Edu?      Excl. in GC?    No data found.  Updated Vital Signs BP (!) 144/94   Pulse 76   Temp 98.6 F (37 C)   Resp 18   LMP 11/23/2016   SpO2 98%   Visual Acuity Right Eye Distance:   Left Eye Distance:   Bilateral Distance:    Right Eye Near:   Left Eye Near:    Bilateral Near:     Physical Exam  Constitutional: She is oriented to person, place, and time. She appears well-developed and well-nourished. No distress.  HENT:  Head: Normocephalic and atraumatic.  Mouth/Throat: Oropharynx is clear and moist.  Eyes: EOM are normal. Pupils are equal, round, and reactive to light.  Neck: Normal range of motion. Neck supple. No neck rigidity.  Cardiovascular: Normal rate, regular rhythm and normal heart sounds.  Pulmonary/Chest: Effort normal and breath sounds normal.  Neurological: She is alert and oriented to person, place, and time. She has normal strength. She displays a negative Romberg sign. Coordination and gait normal.  Skin: Skin is warm  and dry.     UC Treatments / Results  Labs (all labs ordered are listed, but only abnormal results are displayed) Labs Reviewed - No data to display  EKG  EKG Interpretation None       Radiology No results found.  Procedures Procedures (including critical care time)  Medications Ordered in UC Medications  ketorolac (TORADOL) 30 MG/ML injection 30 mg (30 mg Intramuscular Given 12/15/16 1401)  metoCLOPramide (REGLAN) injection 5 mg (5 mg Intramuscular Given 12/15/16 1402)  diphenhydrAMINE (BENADRYL) injection 25 mg (25 mg Intramuscular Given 12/15/16 1401)     Initial Impression / Assessment and Plan / UC Course  I have reviewed the triage vital signs and the nursing notes.  Pertinent labs & imaging results that were available during my care of the patient were reviewed by me and considered in my medical decision making (see chart for details).  Clinical Course as of Dec 16 1418  Thu Dec 15, 2016  1419 Rates pain 6/10 and improving  [NB]    Clinical Course User Index [NB] Linus MakoBurky, Natalie B, NP    Without neurological findings on exam. Patient states feels similar to previous headaches. toradol reglan and benadryl in clinic today. Pain improved. Rest, push fluids, aleve or ibuprofen after 8p tonight if needed. If symptoms worsen or do not improve in the next week to return to be seen or to follow up with PCP. Patient verbalized understanding and agreeable to plan.    Final Clinical Impressions(s) / UC Diagnoses   Final diagnoses:  Acute nonintractable headache, unspecified headache type    ED Discharge Orders    None       Controlled Substance Prescriptions [place] Controlled Substance Registry consulted? Not Applicable   Georgetta HaberBurky, Natalie B, NP 12/15/16 1420    Linus MakoBurky, Natalie B, NP 12/15/16 1421

## 2016-12-15 NOTE — ED Triage Notes (Signed)
Pt here for headache since six am. Reports that she took aleve but vomited it up. Pain in the top of her head with N,V.

## 2017-01-22 ENCOUNTER — Encounter (HOSPITAL_COMMUNITY): Payer: Self-pay | Admitting: Physician Assistant

## 2017-01-22 ENCOUNTER — Ambulatory Visit (INDEPENDENT_AMBULATORY_CARE_PROVIDER_SITE_OTHER): Payer: Medicaid Other

## 2017-01-22 ENCOUNTER — Ambulatory Visit (HOSPITAL_COMMUNITY)
Admission: EM | Admit: 2017-01-22 | Discharge: 2017-01-22 | Disposition: A | Discharge status: Home / Self Care | Payer: Medicaid Other | Attending: Internal Medicine | Admitting: Internal Medicine

## 2017-01-22 ENCOUNTER — Ambulatory Visit (HOSPITAL_COMMUNITY): Payer: Medicaid Other

## 2017-01-22 ENCOUNTER — Other Ambulatory Visit: Payer: Self-pay

## 2017-01-22 DIAGNOSIS — G43909 Migraine, unspecified, not intractable, without status migrainosus: Secondary | ICD-10-CM | POA: Insufficient documentation

## 2017-01-22 DIAGNOSIS — M79675 Pain in left toe(s): Secondary | ICD-10-CM

## 2017-01-22 DIAGNOSIS — Z7982 Long term (current) use of aspirin: Secondary | ICD-10-CM | POA: Insufficient documentation

## 2017-01-22 DIAGNOSIS — X58XXXA Exposure to other specified factors, initial encounter: Secondary | ICD-10-CM | POA: Insufficient documentation

## 2017-01-22 DIAGNOSIS — R110 Nausea: Secondary | ICD-10-CM | POA: Insufficient documentation

## 2017-01-22 DIAGNOSIS — Z3202 Encounter for pregnancy test, result negative: Secondary | ICD-10-CM | POA: Diagnosis not present

## 2017-01-22 DIAGNOSIS — S99922A Unspecified injury of left foot, initial encounter: Secondary | ICD-10-CM | POA: Diagnosis present

## 2017-01-22 DIAGNOSIS — R350 Frequency of micturition: Secondary | ICD-10-CM | POA: Insufficient documentation

## 2017-01-22 DIAGNOSIS — W2203XA Walked into furniture, initial encounter: Secondary | ICD-10-CM | POA: Diagnosis not present

## 2017-01-22 DIAGNOSIS — R300 Dysuria: Secondary | ICD-10-CM | POA: Insufficient documentation

## 2017-01-22 DIAGNOSIS — R1030 Lower abdominal pain, unspecified: Secondary | ICD-10-CM | POA: Diagnosis not present

## 2017-01-22 DIAGNOSIS — N898 Other specified noninflammatory disorders of vagina: Secondary | ICD-10-CM

## 2017-01-22 LAB — POCT PREGNANCY, URINE: Preg Test, Ur: NEGATIVE

## 2017-01-22 LAB — POCT URINALYSIS DIP (DEVICE)
Bilirubin Urine: NEGATIVE
GLUCOSE, UA: NEGATIVE mg/dL
Ketones, ur: NEGATIVE mg/dL
NITRITE: NEGATIVE
Protein, ur: 100 mg/dL — AB
Specific Gravity, Urine: 1.02 (ref 1.005–1.030)
UROBILINOGEN UA: 0.2 mg/dL (ref 0.0–1.0)
pH: 7 (ref 5.0–8.0)

## 2017-01-22 MED ORDER — CEFTRIAXONE SODIUM 250 MG IJ SOLR
250.0000 mg | Freq: Once | INTRAMUSCULAR | Status: AC
  Administered 2017-01-22: 250 mg via INTRAMUSCULAR

## 2017-01-22 MED ORDER — AZITHROMYCIN 250 MG PO TABS
ORAL_TABLET | ORAL | Status: AC
  Filled 2017-01-22: qty 4

## 2017-01-22 MED ORDER — AZITHROMYCIN 250 MG PO TABS
1000.0000 mg | ORAL_TABLET | Freq: Once | ORAL | Status: AC
  Administered 2017-01-22: 1000 mg via ORAL

## 2017-01-22 MED ORDER — LIDOCAINE HCL (PF) 1 % IJ SOLN
INTRAMUSCULAR | Status: AC
  Filled 2017-01-22: qty 2

## 2017-01-22 MED ORDER — FLUCONAZOLE 150 MG PO TABS: 150.0000 mg | ORAL_TABLET | Freq: Every day | ORAL | 0 refills | Status: DC

## 2017-01-22 MED ORDER — CEFTRIAXONE SODIUM 250 MG IJ SOLR
INTRAMUSCULAR | Status: AC
  Filled 2017-01-22: qty 250

## 2017-01-22 MED ORDER — NAPROXEN 500 MG PO TABS: 500.0000 mg | ORAL_TABLET | Freq: Two times a day (BID) | ORAL | 0 refills | Status: AC

## 2017-01-22 NOTE — ED Notes (Signed)
Urine placed in lab 

## 2017-01-22 NOTE — Discharge Instructions (Addendum)
Xray negative for fracture/dislocation. Naproxen as directed. Ice compress. Post op boot to prevent further injury. This can take up to 3-4 weeks to completely resolve, but you should be feeling better each week. Follow up with PCP if symptoms not improving, does not resolve, worsens.  Urine negative for pregnancy. Urine showed possibility of UTI, will send for culture. You were treated empirically for gonorrhea, chlamydia, yeast. Azithromycin 1g by mouth and Rocephin 250mg  injection given in office today. Start diflucan as directed. Cytology sent, you will be contacted with any positive results that requires further treatment. Refrain from sexual activity for the next 7 days. Monitor for any worsening of symptoms, fever, abdominal pain, nausea, vomiting, to follow up for reevaluation.

## 2017-01-22 NOTE — ED Provider Notes (Signed)
[facility]    CSN: [id number] Arrival date & time: 01/22/17  1823     History   Chief Complaint Chief Complaint  Patient presents with  . Toe Injury    HPI Jaime Shelton is a 26 y.o. female.   26 year old female comes in for 1 day history of left 4th toe pain after stubbing her toe into a dresser. No obvious swelling. Denies numbness/tingling. Has not taken anything for the symptoms. States painful weight bearing.   She is also complaining if vaginal irritation with cloudy discharge for the past 3-4 days. Intermittent low abdominal pain with mild nausea. Denies vomiting. Denies fever, chills, night sweats. States some urinary frequency, dysuria. Denies hematuria. Sexually active with 2 partners, 1 female, 1 female, no condom use. Uses withdrawal as birth control method. LMP 12/01/2016, states cycles has always been irregular.        Past Medical History:  Diagnosis Date  . Abdominal pain    Hx: of  . Complication of anesthesia   . Headache(784.0)    Hx: of Migraines  . Heart murmur   . PONV (postoperative nausea and vomiting)   . Shortness of breath    Hx: of    There are no active problems to display for this patient.   Past Surgical History:  Procedure Laterality Date  . AXILLARY SURGERY  06/26/2012   cyst removed  . EAR CYST EXCISION Left 06/26/2012   Procedure: excison of left axillary CYST REMOVAL;  Surgeon: Lodema PilotBrian Layton, DO;  Location: [facility];  Service: General;  Laterality: Left;  . NO PAST SURGERIES      OB History    No data available       Home Medications    Prior to Admission medications   Medication Sig Start Date End Date Taking? Authorizing Provider  aspirin-acetaminophen-caffeine (EXCEDRIN MIGRAINE) [phone] MG per tablet Take 1 tablet by mouth every 6 (six) hours as needed for headache. Patient not taking: Reported on 01/27/2015 07/15/14   Lurene ShadowPhelps, Erin O, PA-C  doxycycline (VIBRA-TABS) 100 MG tablet Take 1 tablet (100 mg  total) by mouth 2 (two) times daily. Patient not taking: Reported on 07/15/2014 04/02/14   Elwin MochaWalden, Blair, MD  fluconazole (DIFLUCAN) 150 MG tablet Take 1 tablet (150 mg total) by mouth daily. Take second dose 72 hours later if symptoms still persists. 01/22/17   Cathie HoopsYu, Yarelly Kuba V, PA-C  ibuprofen (ADVIL,MOTRIN) 200 MG tablet Take 400 mg by mouth every 6 (six) hours as needed (pain).    [provider]  naproxen (NAPROSYN) 500 MG tablet Take 1 tablet (500 mg total) by mouth 2 (two) times daily for 10 days. 01/22/17 02/01/17  Belinda FisherYu, Xavier Fournier V, PA-C  oxyCODONE-acetaminophen (PERCOCET/ROXICET) 5-325 MG per tablet Take 1 tablet by mouth every 6 (six) hours as needed for severe pain. Patient not taking: Reported on 07/15/2014 04/02/14   Elwin MochaWalden, Blair, MD    Family History Family History  Problem Relation Age of Onset  . HIV Father   . Hypertension Mother     Social History Social History   Tobacco Use  . Smoking status: Never Smoker  . Smokeless tobacco: Never Used  Substance Use Topics  . Alcohol use: Yes    Comment: occasional  . Drug use: No     Allergies   Patient has no known allergies.   Review of Systems Review of Systems  Reason unable to perform ROS: See HPI as above.     Physical Exam Triage  Vital Signs ED Triage Vitals  Enc Vitals Group     BP 01/22/17 1838 124/72     Pulse Rate 01/22/17 1838 86     Resp --      Temp 01/22/17 1838 98.7 F (37.1 C)     Temp Source 01/22/17 1838 Oral     SpO2 01/22/17 1838 100 %     Weight --      Height --      Head Circumference --      Peak Flow --      Pain Score 01/22/17 1839 9     Pain Loc --      Pain Edu? --      Excl. in GC? --    No data found.  Updated Vital Signs BP 124/72 (BP Location: Right Arm)   Pulse 86   Temp 98.7 F (37.1 C) (Oral)   LMP 12/01/2016 (Approximate)   SpO2 100%   Physical Exam  Constitutional: She is oriented to person, place, and time. She appears well-developed and well-nourished. No  distress.  HENT:  Head: Normocephalic and atraumatic.  Eyes: Conjunctivae are normal. Pupils are equal, round, and reactive to light.  Cardiovascular: Normal rate, regular rhythm and normal heart sounds. Exam reveals no gallop and no friction rub.  No murmur heard. Pulmonary/Chest: Effort normal and breath sounds normal. She has no wheezes. She has no rales.  Abdominal: Soft. Bowel sounds are normal. She exhibits no mass. There is no tenderness. There is no rebound, no guarding and no CVA tenderness.  Musculoskeletal:  No obvious swelling, erythema, contusion seen. Tenderness to palpation of 3rd, 4th, and 5th toe. Full ROM. Sensation intact. Pedal pulse 2+ and equal.   Neurological: She is alert and oriented to person, place, and time.  Skin: Skin is warm and dry.  Psychiatric: She has a normal mood and affect. Her behavior is normal. Judgment normal.     UC Treatments / Results  Labs (all labs ordered are listed, but only abnormal results are displayed) Labs Reviewed  POCT URINALYSIS DIP (DEVICE) - Abnormal; Notable for the following components:      Result Value   Hgb urine dipstick TRACE (*)    Protein, ur 100 (*)    Leukocytes, UA TRACE (*)    All other components within normal limits  URINE CULTURE  POCT PREGNANCY, URINE  URINE CYTOLOGY ANCILLARY ONLY    EKG  EKG Interpretation None       Radiology Dg Foot Complete Left  Result Date: 01/22/2017 CLINICAL DATA:  Pain after trauma EXAM: LEFT FOOT - COMPLETE 3+ VIEW COMPARISON:  None. FINDINGS: There is no evidence of fracture or dislocation. There is no evidence of arthropathy or other focal bone abnormality. Soft tissues are unremarkable. IMPRESSION: Negative. Electronically Signed   By: Gerome Samavid  Williams III M.D   On: 01/22/2017 19:10    Procedures Procedures (including critical care time)  Medications Ordered in UC Medications  azithromycin (ZITHROMAX) tablet 1,000 mg (1,000 mg Oral Given 01/22/17 1942)    cefTRIAXone (ROCEPHIN) injection 250 mg (250 mg Intramuscular Given 01/22/17 1943)     Initial Impression / Assessment and Plan / UC Course  I have reviewed the triage vital signs and the nursing notes.  Pertinent labs & imaging results that were available during my care of the patient were reviewed by me and considered in my medical decision making (see chart for details).    Xray negative for fracture/dislocation. NSAIDs, ice compress, elevation. Provided  post op boot to prevent further injury to the toes. Discussed with patient this may take a few weeks to resolve, but should be feeling better each week. Follow up with PCP for further management needed.  Urine negative for pregnancy. Does show trace leukocytes, will await culture for treatment. Patient was treated empirically for GC, yeast. Azithromycin and Rocephin given in office today. Diflucan as directed. Cytology sent, patient will be contacted with any positive results that require additional treatment. Patient to refrain from sexual activity for the next 7 days. Return precautions given.    Final Clinical Impressions(s) / UC Diagnoses   Final diagnoses:  Pain of toe of left foot  Vaginal discharge    ED Discharge Orders        Ordered    naproxen (NAPROSYN) 500 MG tablet  2 times daily     01/22/17 1922    fluconazole (DIFLUCAN) 150 MG tablet  Daily     01/22/17 1922         Belinda Fisher, [place] 01/22/17 1953

## 2017-01-22 NOTE — ED Triage Notes (Signed)
Per pt her left 4th toe is hurting, per pt it started 2 hours, per pt she thinks it started when she ran into the dresser, per pt she thinks she has yeast infection, per pt her vaginal region is very irritated, per pt she is not having bad smell but she is having cloudy discharge.

## 2017-01-23 LAB — URINE CYTOLOGY ANCILLARY ONLY
CHLAMYDIA, DNA PROBE: NEGATIVE
NEISSERIA GONORRHEA: NEGATIVE
Trichomonas: NEGATIVE

## 2017-01-24 LAB — URINE CYTOLOGY ANCILLARY ONLY: Candida vaginitis: NEGATIVE

## 2017-01-24 LAB — URINE CULTURE

## 2017-10-29 ENCOUNTER — Encounter (INDEPENDENT_AMBULATORY_CARE_PROVIDER_SITE_OTHER): Payer: Self-pay | Admitting: Ophthalmology

## 2017-10-29 ENCOUNTER — Other Ambulatory Visit: Payer: Self-pay

## 2017-10-29 ENCOUNTER — Ambulatory Visit (INDEPENDENT_AMBULATORY_CARE_PROVIDER_SITE_OTHER): Payer: Medicaid Other | Admitting: Ophthalmology

## 2017-10-29 ENCOUNTER — Emergency Department (HOSPITAL_COMMUNITY)
Admission: EM | Admit: 2017-10-29 | Discharge: 2017-10-29 | Disposition: A | Discharge status: Home / Self Care | Payer: Medicaid Other | Attending: Emergency Medicine | Admitting: Emergency Medicine

## 2017-10-29 ENCOUNTER — Encounter (HOSPITAL_COMMUNITY): Payer: Self-pay | Admitting: Emergency Medicine

## 2017-10-29 DIAGNOSIS — H33102 Unspecified retinoschisis, left eye: Secondary | ICD-10-CM | POA: Diagnosis not present

## 2017-10-29 DIAGNOSIS — H10502 Unspecified blepharoconjunctivitis, left eye: Secondary | ICD-10-CM

## 2017-10-29 DIAGNOSIS — H53002 Unspecified amblyopia, left eye: Secondary | ICD-10-CM

## 2017-10-29 DIAGNOSIS — Z79899 Other long term (current) drug therapy: Secondary | ICD-10-CM | POA: Insufficient documentation

## 2017-10-29 DIAGNOSIS — H5712 Ocular pain, left eye: Secondary | ICD-10-CM | POA: Diagnosis present

## 2017-10-29 DIAGNOSIS — H53142 Visual discomfort, left eye: Secondary | ICD-10-CM | POA: Diagnosis not present

## 2017-10-29 DIAGNOSIS — H209 Unspecified iridocyclitis: Secondary | ICD-10-CM | POA: Diagnosis not present

## 2017-10-29 DIAGNOSIS — H5789 Other specified disorders of eye and adnexa: Secondary | ICD-10-CM

## 2017-10-29 DIAGNOSIS — H15102 Unspecified episcleritis, left eye: Secondary | ICD-10-CM | POA: Insufficient documentation

## 2017-10-29 MED ORDER — OXYCODONE-ACETAMINOPHEN 5-325 MG PO TABS
1.0000 | ORAL_TABLET | Freq: Once | ORAL | Status: AC
  Administered 2017-10-29: 1 via ORAL
  Filled 2017-10-29: qty 1

## 2017-10-29 MED ORDER — FLUORESCEIN SODIUM 1 MG OP STRP
1.0000 | ORAL_STRIP | Freq: Once | OPHTHALMIC | Status: AC
  Administered 2017-10-29: 1 via OPHTHALMIC
  Filled 2017-10-29: qty 1

## 2017-10-29 MED ORDER — TETRACAINE HCL 0.5 % OP SOLN
2.0000 [drp] | Freq: Once | OPHTHALMIC | Status: AC
  Administered 2017-10-29: 2 [drp] via OPHTHALMIC
  Filled 2017-10-29: qty 4

## 2017-10-29 NOTE — ED Notes (Signed)
ED Provider at bedside. 

## 2017-10-29 NOTE — ED Triage Notes (Signed)
Pt states she was play fighting last night and then went to bed, when she woke up she could barely open her eye. It is painful to open the patient states

## 2017-10-29 NOTE — ED Provider Notes (Signed)
[facility]-EMERGENCY DEPT Provider Note   CSN: [id number] Arrival date & time: 10/29/17  0850     History   Chief Complaint Chief Complaint  Patient presents with  . Eye Injury    HPI Jaime Shelton is a 27 y.o. female.  HPI  Jaime Shelton is a 27yo female with no significant past medical history who presents to the emergency department for evaluation of left eye pain, redness, photophobia and tearing. She states that yesterday morning she woke up with left eye redness and felt as if the eyeball is burning. She reports significant photophobia. She has had some tearing, but denies thick discharge. She also noticed some lid swelling as well. She reports some blurred vision in the left eye. States that she was play fighting a few days ago, but does not specifically remember being hit in the face. She has an eye doctor and occasionally uses glasses, no contact lens use. She denies sick contacts with similar symptoms. Denies fever, chills, headache, painful ROM, vomiting, cough, congestion.   Past Medical History:  Diagnosis Date  . Abdominal pain    Hx: of  . Complication of anesthesia   . Headache(784.0)    Hx: of Migraines  . Heart murmur   . PONV (postoperative nausea and vomiting)   . Shortness of breath    Hx: of    There are no active problems to display for this patient.   Past Surgical History:  Procedure Laterality Date  . AXILLARY SURGERY  06/26/2012   cyst removed  . EAR CYST EXCISION Left 06/26/2012   Procedure: excison of left axillary CYST REMOVAL;  Surgeon: Lodema PilotBrian Layton, DO;  Location: [facility] OR;  Service: General;  Laterality: Left;  . NO PAST SURGERIES       OB History   None      Home Medications    Prior to Admission medications   Medication Sig Start Date End Date Taking? Authorizing Provider  Aspirin-Acetaminophen-Caffeine (GOODY HEADACHE PO) Take 1 Package by mouth daily as needed (headache).   Yes [provider]    aspirin-acetaminophen-caffeine (EXCEDRIN MIGRAINE) [phone] MG per tablet Take 1 tablet by mouth every 6 (six) hours as needed for headache. Patient not taking: Reported on 10/29/2017 07/15/14   Lurene ShadowPhelps, Erin O, PA-C  doxycycline (VIBRA-TABS) 100 MG tablet Take 1 tablet (100 mg total) by mouth 2 (two) times daily. Patient not taking: Reported on 07/15/2014 04/02/14   Elwin MochaWalden, Blair, MD  fluconazole (DIFLUCAN) 150 MG tablet Take 1 tablet (150 mg total) by mouth daily. Take second dose 72 hours later if symptoms still persists. Patient not taking: Reported on 10/29/2017 01/22/17   Belinda FisherYu, Amy V, PA-C  oxyCODONE-acetaminophen (PERCOCET/ROXICET) 5-325 MG per tablet Take 1 tablet by mouth every 6 (six) hours as needed for severe pain. Patient not taking: Reported on 07/15/2014 04/02/14   Elwin MochaWalden, Blair, MD    Family History Family History  Problem Relation Age of Onset  . HIV Father   . Hypertension Mother     Social History Social History   Tobacco Use  . Smoking status: Never Smoker  . Smokeless tobacco: Never Used  Substance Use Topics  . Alcohol use: Yes    Comment: occasional  . Drug use: No     Allergies   Patient has no known allergies.   Review of Systems Review of Systems  Constitutional: Negative for chills and fever.  Eyes: Positive for photophobia, pain, discharge, redness and visual disturbance. Negative for itching.  Neurological: Negative for headaches.  All other systems reviewed and are negative.    Physical Exam Updated Vital Signs BP 120/87   Pulse 83   Temp 98.6 F (37 C) (Oral)   Resp 16   Ht 5\' 4"  (1.626 m)   Wt 47.6 kg   LMP 10/02/2017   SpO2 99%   BMI 18.02 kg/m   Physical Exam  Constitutional: She appears well-developed and well-nourished. No distress.  HENT:  Head: Normocephalic and atraumatic.  Eyes: EOM are normal. Right eye exhibits no discharge. Left eye exhibits no discharge.  Appearance. Left eye with significant tearing and diffuse scleral  erythema. Consensual photophobia present. PERRL intact. EOMI without pain.  Corneal Abrasion Exam VCO. Risks, benefits and alternatives explained. 2 drops of tetracaine (PONTOCAINE) 0.5 % ophthalmic solution were applied to the left eye. Fluorescein 1 MG ophthalmic strip applied the the surface of the left eye Woods lamp used to screen for abrasion. No increased fluorescein uptake. No corneal ulcer. No hyphema. No dendritic lesion. Negative Seidel sign. No foreign bodies noted. Patient tolerated the procedure well TONOPEN: 16LE  Pulmonary/Chest: Effort normal. No respiratory distress.  Neurological: She is alert. Coordination normal.  Skin: She is not diaphoretic.  Psychiatric: She has a normal mood and affect. Her behavior is normal.  Nursing note and vitals reviewed.   ED Treatments / Results  Labs (all labs ordered are listed, but only abnormal results are displayed) Labs Reviewed - No data to display  EKG None  Radiology No results found.  Procedures Procedures (including critical care time)  Medications Ordered in ED Medications  fluorescein ophthalmic strip 1 strip (1 strip Left Eye Given 10/29/17 0947)  tetracaine (PONTOCAINE) 0.5 % ophthalmic solution 2 drop (2 drops Left Eye Given 10/29/17 0947)  oxyCODONE-acetaminophen (PERCOCET/ROXICET) 5-325 MG per tablet 1 tablet (1 tablet Oral Given 10/29/17 1043)     Initial Impression / Assessment and Plan / ED Course  I have reviewed the triage vital signs and the nursing notes.  Pertinent labs & imaging results that were available during my care of the patient were reviewed by me and considered in my medical decision making (see chart for details).    Concern for iritis given scleral erythema, consensual photophobia and excessive tearing.  I was unable to use the slit lamp to evaluate patient, as she was having so much pain that she was unable to open her eye for the exam.  Normal intraocular pressure and I do not suspect  acute angle-closure glaucoma.  Symptoms are likely conjunctivitis given photophobia, significant pain and no purulent discharge or known contacts with similar symptoms.  I discussed this patient with on-call ophthalmologist Dr. Vanessa Barbara who will see patient in the office this afternoon for further evaluation. Patient has a friend who will take her to the office.    Final Clinical Impressions(s) / ED Diagnoses   Final diagnoses:  Eye redness    ED Discharge Orders    None       Lawrence Marseilles 10/29/17 1553    Shaune Pollack, MD 10/31/17 [phone]

## 2017-10-29 NOTE — Progress Notes (Signed)
[facility] - Clinic Note  10/29/2017     CHIEF COMPLAINT Patient presents for Eye Problem   HISTORY OF PRESENT ILLNESS: Jaime Shelton is a 27 y.o. female who presents to the clinic today for:   HPI    1 day history of eye pain / sensitivity to light OS. Awoke on Saturday morning with discomfort and has progressively worsened. Presented to [name] ED who referred here for further eval.  History of amblyopia OS with patching of OD as a child -- pt does not remember treating physician/ophthalmologist. History of spectacles but does not use or even have a pair anymore.   Last edited by Jaime Shelton, Jaime Pinkham, MD on 10/29/2017  1:08 PM. (History)      Referring physician: [facility] [address] [place], [place] [address]  HISTORICAL INFORMATION:   Selected notes from the MEDICAL RECORD NUMBER    CURRENT MEDICATIONS: No current outpatient medications on file. (Ophthalmic Drugs)   No current facility-administered medications for this visit.  (Ophthalmic Drugs)   Current Outpatient Medications (Other)  Medication Sig  . aspirin-acetaminophen-caffeine (EXCEDRIN MIGRAINE) 250-250-65 MG per tablet Take 1 tablet by mouth every 6 (six) hours as needed for headache. (Patient not taking: Reported on 10/29/2017)  . Aspirin-Acetaminophen-Caffeine (GOODY HEADACHE PO) Take 1 Package by mouth daily as needed (headache).  . doxycycline (VIBRA-TABS) 100 MG tablet Take 1 tablet (100 mg total) by mouth 2 (two) times daily. (Patient not taking: Reported on 07/15/2014)  . fluconazole (DIFLUCAN) 150 MG tablet Take 1 tablet (150 mg total) by mouth daily. Take second dose 72 hours later if symptoms still persists. (Patient not taking: Reported on 10/29/2017)  . oxyCODONE-acetaminophen (PERCOCET/ROXICET) 5-325 MG per tablet Take 1 tablet by mouth every 6 (six) hours as needed for severe pain. (Patient not taking: Reported on 07/15/2014)   No current facility-administered medications  for this visit.  (Other)      REVIEW OF SYSTEMS: ROS    Positive for: Gastrointestinal, Eyes   Negative for: Constitutional, Neurological, Skin, Genitourinary, Musculoskeletal, HENT, Endocrine, Cardiovascular, Respiratory, Psychiatric, Allergic/Imm, Heme/Lymph   Last edited by Jaime Shelton, Evangelyne Loja, MD on 10/29/2017  1:08 PM. (History)       ALLERGIES No Known Allergies  PAST MEDICAL HISTORY Past Medical History:  Diagnosis Date  . Abdominal pain    Hx: of  . Complication of anesthesia   . Headache(784.0)    Hx: of Migraines  . Heart murmur   . PONV (postoperative nausea and vomiting)   . Shortness of breath    Hx: of   Past Surgical History:  Procedure Laterality Date  . AXILLARY SURGERY  06/26/2012   cyst removed  . EAR CYST EXCISION Left 06/26/2012   Procedure: excison of left axillary CYST REMOVAL;  Surgeon: Jaime PilotBrian Layton, DO;  Location: [facility] OR;  Service: General;  Laterality: Left;  . NO PAST SURGERIES      FAMILY HISTORY Family History  Problem Relation Age of Onset  . HIV Father   . Hypertension Mother     SOCIAL HISTORY Social History   Tobacco Use  . Smoking status: Never Smoker  . Smokeless tobacco: Never Used  Substance Use Topics  . Alcohol use: Yes    Comment: occasional  . Drug use: No         OPHTHALMIC EXAM:  Base Eye Exam    Visual Acuity (Snellen - Linear)      Right Left   Dist [place] 20/15 20/300  Dist ph [place]  20/80 +2       Tonometry (Tonopen, 12:00 PM)      Right Left   Pressure 17 11       Pupils      Dark Light Shape React APD   Right 3 2 Round 2+ -   Left 2.5 1.5 Round 2+ -       Visual Fields (Counting fingers)      Left Right    Full Full       Extraocular Movement      Right Left    Full Full       Neuro/Psych    Oriented x3:  Yes   Mood/Affect:  Normal       Dilation    Both eyes:  1.0% Mydriacyl, 2.5% Phenylephrine @ 12:12 PM        Slit Lamp and Fundus Exam    Slit Lamp Exam      Right Left    Lids/Lashes +MGD 1+ upper lid edema; +MGD   Conjunctiva/Sclera melanosis tr injection; melanosis   Cornea Clear Clear   Anterior Chamber Deep and quiet 1/2+ cell/pigment   Iris Round and reactive Round and reactive   Lens Clear Clear   Vitreous Normal Normal       Fundus Exam      Right Left   Disc Normal tilted; PPA   C/D Ratio 0.3 0.3   Macula flat; good foveal reflex flat; blunted foveal reflex; RPE mottling   Vessels Normal Normal   Periphery attached inf temporal retinoschisis; otherwise attached          IMAGING AND PROCEDURES  Imaging and Procedures for 10/29/17           ASSESSMENT/PLAN:    ICD-10-CM   1. 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 Mild iritis and blepharoconjunctivitis OS - 1/2+ cell/pigment in [facility] with significant photophobia - mild lid edema and conj injection - recommend Inveltys and atropine BID OS - f/u Friday, 845 am  3. Left retinoschisis OS - incidental finding, unrelated to #1,2 - recommend further evaluation once #1,2 stable  4. Amblyopia OS - pt reports history of patching as a child - monitor  Ophthalmic Meds Ordered this visit:  No orders of the defined types were placed in this encounter.      Return in about 1 week (around 11/05/2017).  There are no Patient Instructions on file for this visit.   Explained the diagnoses, plan, and follow up with the patient and they expressed understanding.  Patient expressed understanding of the importance of proper follow up care.   Jaime Shelton, M.D., Ph.D. Diseases & Surgery of the Retina and Vitreous [facility] 10/29/17     Abbreviations: M myopia (nearsighted); A astigmatism; H hyperopia (farsighted); P presbyopia; Mrx spectacle prescription;  CTL contact lenses; OD right eye; OS left eye; OU both  eyes  XT exotropia; ET esotropia; PEK punctate epithelial keratitis; PEE punctate epithelial erosions; DES dry eye syndrome; MGD meibomian gland dysfunction; ATs artificial tears; PFAT's preservative free artificial tears; NSC nuclear sclerotic cataract; PSC posterior subcapsular cataract; ERM epi-retinal membrane; PVD posterior vitreous detachment; RD retinal detachment; DM diabetes mellitus; DR diabetic retinopathy; NPDR non-proliferative diabetic retinopathy; PDR proliferative diabetic retinopathy; CSME clinically significant macular edema; DME diabetic macular edema; dbh  dot blot hemorrhages; CWS cotton wool spot; POAG primary open angle glaucoma; C/D cup-to-disc ratio; HVF humphrey visual field; GVF goldmann visual field; OCT optical coherence tomography; IOP intraocular pressure; BRVO Branch retinal vein occlusion; CRVO central retinal vein occlusion; CRAO central retinal artery occlusion; BRAO branch retinal artery occlusion; RT retinal tear; SB scleral buckle; PPV pars plana vitrectomy; VH Vitreous hemorrhage; PRP panretinal laser photocoagulation; IVK intravitreal kenalog; VMT vitreomacular traction; MH Macular hole;  NVD neovascularization of the disc; NVE neovascularization elsewhere; AREDS age related eye disease study; ARMD age related macular degeneration; POAG primary open angle glaucoma; EBMD epithelial/anterior basement membrane dystrophy; ACIOL anterior chamber intraocular lens; IOL intraocular lens; PCIOL posterior chamber intraocular lens; Phaco/IOL phacoemulsification with intraocular lens placement; [place] photorefractive keratectomy; LASIK laser assisted in situ keratomileusis; HTN hypertension; DM diabetes mellitus; COPD chronic obstructive pulmonary disease

## 2017-10-29 NOTE — Discharge Instructions (Addendum)
Go to the eye doctor.  He is expecting you around 1130 or 1145.  His address is highlighted below.  The office will be locked, please call him when you arrive and he will let you in.  Phone number [phone]

## 2017-11-03 ENCOUNTER — Encounter (INDEPENDENT_AMBULATORY_CARE_PROVIDER_SITE_OTHER): Payer: Self-pay | Admitting: Ophthalmology

## 2017-11-03 ENCOUNTER — Ambulatory Visit (INDEPENDENT_AMBULATORY_CARE_PROVIDER_SITE_OTHER): Payer: Medicaid Other | Admitting: Ophthalmology

## 2017-11-03 DIAGNOSIS — H3322 Serous retinal detachment, left eye: Secondary | ICD-10-CM

## 2017-11-03 DIAGNOSIS — H10502 Unspecified blepharoconjunctivitis, left eye: Secondary | ICD-10-CM

## 2017-11-03 DIAGNOSIS — H3581 Retinal edema: Secondary | ICD-10-CM

## 2017-11-03 DIAGNOSIS — H209 Unspecified iridocyclitis: Secondary | ICD-10-CM

## 2017-11-03 DIAGNOSIS — H53002 Unspecified amblyopia, left eye: Secondary | ICD-10-CM | POA: Diagnosis not present

## 2017-11-03 NOTE — Progress Notes (Signed)
[facility] Note  11/03/2017     CHIEF COMPLAINT Patient presents for Retina Follow Up   HISTORY OF PRESENT ILLNESS: Jaime Shelton is a 27 y.o. female who presents to the clinic today for:   HPI    Retina Follow Up    In left eye.  This started 5 days ago.  Severity is mild.  Since onset it is stable.  I, the attending physician,  performed the HPI with the patient and updated documentation appropriately.          Comments    F/U Iritis OS. Patient states she has sun light and bright light sensitivity  Os, and occasional achy discomfort OS. Pt is using Atropine and Inveltys gtts as instructed.          Last edited by Bernarda Caffey, MD on 11/03/2017  1:16 PM. (History)      Referring physician: Care, [facility] [place], [place] [phone]  HISTORICAL INFORMATION:   Selected notes from the [place]: No current outpatient medications on file. (Ophthalmic Drugs)   No current facility-administered medications for this visit.  (Ophthalmic Drugs)   Current Outpatient Medications (Other)  Medication Sig  . aspirin-acetaminophen-caffeine (EXCEDRIN MIGRAINE) 250-250-65 MG per tablet Take 1 tablet by mouth every 6 (six) hours as needed for headache.  . Aspirin-Acetaminophen-Caffeine (GOODY HEADACHE PO) Take 1 Package by mouth daily as needed (headache).  . doxycycline (VIBRA-TABS) 100 MG tablet Take 1 tablet (100 mg total) by mouth 2 (two) times daily.  . fluconazole (DIFLUCAN) 150 MG tablet Take 1 tablet (150 mg total) by mouth daily. Take second dose 72 hours later if symptoms still persists.  Marland Kitchen oxyCODONE-acetaminophen (PERCOCET/ROXICET) 5-325 MG per tablet Take 1 tablet by mouth every 6 (six) hours as needed for severe pain.   No current facility-administered medications for this visit.  (Other)      REVIEW OF SYSTEMS: ROS    Positive for: Eyes   Negative for: Constitutional,  Gastrointestinal, Neurological, Skin, Genitourinary, Musculoskeletal, HENT, Endocrine, Cardiovascular, Respiratory, Psychiatric, Allergic/Imm, Heme/Lymph   Last edited by Zenovia Jordan, LPN on 0/09/6759  9:50 AM. (History)       ALLERGIES No Known Allergies  PAST MEDICAL HISTORY Past Medical History:  Diagnosis Date  . Abdominal pain    Hx: of  . Complication of anesthesia   . Headache(784.0)    Hx: of Migraines  . Heart murmur   . PONV (postoperative nausea and vomiting)   . Shortness of breath    Hx: of   Past Surgical History:  Procedure Laterality Date  . AXILLARY SURGERY  06/26/2012   cyst removed  . EAR CYST EXCISION Left 06/26/2012   Procedure: excison of left axillary CYST REMOVAL;  Surgeon: Madilyn Hook, DO;  Location: [facility];  Service: General;  Laterality: Left;  . NO PAST SURGERIES      FAMILY HISTORY Family History  Problem Relation Age of Onset  . HIV Father   . Hypertension Mother     SOCIAL HISTORY Social History   Tobacco Use  . Smoking status: Never Smoker  . Smokeless tobacco: Never Used  Substance Use Topics  . Alcohol use: Yes    Comment: occasional  . Drug use: No         OPHTHALMIC EXAM:  Base Eye Exam    Visual Acuity (Snellen - Linear)      Right Left   Dist [place] 20/20  CF at 3'   Dist ph [place] NI        Tonometry (Tonopen, 10:07 AM)      Right Left   Pressure 14 19       Pupils      Dark Light Shape React APD   Right 4 3 Round Brisk None   Left 4 3 Round Brisk None       Visual Fields (Counting fingers)      Left Right    Full Full       Extraocular Movement      Right Left    Full, Ortho Full, Ortho       Neuro/Psych    Oriented x3:  Yes   Mood/Affect:  Normal       Dilation    Both eyes:  1.0% Mydriacyl, 2.5% Phenylephrine @ 10:07 AM        Slit Lamp and Fundus Exam    Slit Lamp Exam      Right Left   Lids/Lashes +MGD 1+ upper lid edema; +MGD   Conjunctiva/Sclera melanosis tr injection; melanosis    Cornea Clear Clear   Anterior Chamber Deep and quiet Deep, 0.5+ cell/pigment   Iris Round and reactive Round and reactive   Lens Clear Clear   Vitreous Normal Normal       Fundus Exam      Right Left   Disc Normal tilted; PPA   C/D Ratio 0.3 0.3   Macula flat; good foveal reflex flat; blunted foveal reflex; RPE mottling   Vessels Normal Normal   Periphery attached inf temporal SRF/RD          IMAGING AND PROCEDURES  Imaging and Procedures for 10/29/17  OCT, Retina - OU - Both Eyes       Right Eye Quality was good. Central Foveal Thickness: 229. Progression has no prior data. Findings include normal foveal contour, no IRF, no SRF.   Left Eye Quality was good. Central Foveal Thickness: 253. Progression has no prior data. Findings include myopic contour, outer retinal atrophy, normal foveal contour, no IRF.   Notes *Images captured and stored on drive  Diagnosis / Impression:  OD: NFP, No IRF/SRF OS: peripheral SRF inferiorly -- chronic inf RD -- atrophic retina  Clinical management:  See below  Abbreviations: NFP - Normal foveal profile. CME - cystoid macular edema. PED - pigment epithelial detachment. IRF - intraretinal fluid. SRF - subretinal fluid. EZ - ellipsoid zone. ERM - epiretinal membrane. ORA - outer retinal atrophy. ORT - outer retinal tubulation. SRHM - subretinal hyper-reflective material         Repair Retinal Detach, Photocoag - OS - Left Eye       LASER PROCEDURE NOTE  Procedure:  Barrier laser retinopexy using slit lamp laser, LEFT eye   Diagnosis:   Inferior retinal detachment, LEFT eye                     4-6 o'clock   Surgeon: Bernarda Caffey, MD, PhD  Anesthesia: Topical  Informed consent obtained, operative eye marked, and time out performed prior to initiation of laser.   Laser settings:  Lumenis Smart532 laser, slit lamp Lens: [place] PRP 165 Power: 240 mW Spot size: 200 microns Duration: 30 msec  # spots: 497  Placement  of laser: Using a [place]ster PRP 165 contact lens at the slit lamp, laser was placed in 3-5 confluent rows just posterior to inferior retinal detachment from 4-6 oclock ora to  ora. Laser indirect ophthalmoscopy was used to complete the anterior borders at ora: 131 spots; 250 mW power; 70 ms duration  Complications: None.  Patient tolerated the procedure well and received written and verbal post-procedure care information/education.                  ASSESSMENT/PLAN:    ICD-10-CM   1. Left retinal detachment H33.22 Repair Retinal Detach, Photocoag - OS - Left Eye  2. 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 Blepharoconjunctivitis of left eye, unspecified blepharoconjunctivitis type H10.502   4. Amblyopia, left eye H53.002   5. Retinal edema H35.81 OCT, Retina - OU - Both Eyes    1. Retinal detachment, OS  The incidence, risk factors, and natural history of retinal detachment was discussed with patient.  Potential treatment options including delimiting laser, pneumatic retinopexy, scleral buckle, and vitrectomy, cryotherapy and laser, and the use of air, gas, and oil discussed with patient.  The risks of blindness, loss of vision, infection, hemorrhage, cataract progression or lens displacement were discussed with patient. - inf temporal SRF/RD - pt wishes to proceed w/ barricade laser retinopexy OS today (09.27.19) - RBA of procedure discussed, questions answered - informed consent obtained and signed - see procedure note - cont Inveltys OS QID x 7 days - F/U 10 days  2,3. Mild iritis and blepharoconjunctivitis OS - subjectively improved - still with 1/2+ cell/pigment in [facility] - mild lid edema and conj injection improved - can d/c atropine -- Inveltys as above   4.  Amblyopia OS - pt reports history of patching as a child - monitor  Ophthalmic Meds Ordered this visit:  No orders of the defined types were placed in this encounter.      Return in about 10 days (around 11/13/2017) for  F/U laser ret RD OS, DFE, OCT.  There are no Patient Instructions on file for this visit.   Explained the diagnoses, plan, and follow up with the patient and they expressed understanding.  Patient expressed understanding of the importance of proper follow up care.   This document serves as a record of services personally performed by Gardiner Sleeper, MD, PhD. It was created on their behalf by Ernest Mallick, OA, an ophthalmic assistant. The creation of this record is the provider's dictation and/or activities during the visit.    Electronically signed by: Ernest Mallick, OA  09.27.19 1:25 PM   This document serves as a record of services personally performed by Gardiner Sleeper, MD, PhD. It was created on their behalf by Catha Brow, [place], a certified ophthalmic assistant. The creation of this record is the provider's dictation and/or activities during the visit.  Electronically signed by: Catha Brow, COA 09.27.19 1:25 PM   Gardiner Sleeper, M.D., Ph.D. Diseases & Surgery of the Retina and Vitreous [facility]  I have reviewed the above documentation for accuracy and completeness, and I agree with the above. Gardiner Sleeper, M.D., Ph.D. 11/03/17 1:28 PM    Abbreviations: M myopia (nearsighted); A astigmatism; H hyperopia (farsighted); P presbyopia; Mrx spectacle prescription;  CTL contact lenses; OD right eye; OS left eye; OU both eyes  XT exotropia; ET esotropia; PEK punctate epithelial keratitis; PEE punctate epithelial erosions; DES dry eye syndrome; MGD meibomian gland dysfunction; ATs artificial tears; PFAT's preservative free artificial tears; [place] nuclear sclerotic cataract; PSC posterior subcapsular cataract; ERM epi-retinal membrane; PVD posterior vitreous detachment; RD retinal detachment; DM diabetes mellitus; DR diabetic retinopathy; NPDR non-proliferative  diabetic retinopathy; PDR proliferative diabetic retinopathy; CSME clinically significant macular  edema; DME diabetic macular edema; dbh dot blot hemorrhages; CWS cotton wool spot; POAG primary open angle glaucoma; C/D cup-to-disc ratio; HVF humphrey visual field; GVF goldmann visual field; OCT optical coherence tomography; IOP intraocular pressure; BRVO Branch retinal vein occlusion; CRVO central retinal vein occlusion; CRAO central retinal artery occlusion; BRAO branch retinal artery occlusion; RT retinal tear; SB scleral buckle; PPV pars plana vitrectomy; VH Vitreous hemorrhage; PRP panretinal laser photocoagulation; IVK intravitreal kenalog; VMT vitreomacular traction; MH Macular hole;  NVD neovascularization of the disc; NVE neovascularization elsewhere; AREDS age related eye disease study; ARMD age related macular degeneration; POAG primary open angle glaucoma; EBMD epithelial/anterior basement membrane dystrophy; ACIOL anterior chamber intraocular lens; IOL intraocular lens; PCIOL posterior chamber intraocular lens; Phaco/IOL phacoemulsification with intraocular lens placement; [place] photorefractive keratectomy; LASIK laser assisted in situ keratomileusis; HTN hypertension; DM diabetes mellitus; COPD chronic obstructive pulmonary disease

## 2017-11-09 ENCOUNTER — Encounter (INDEPENDENT_AMBULATORY_CARE_PROVIDER_SITE_OTHER): Payer: Medicaid Other | Admitting: Ophthalmology

## 2017-11-13 ENCOUNTER — Encounter (INDEPENDENT_AMBULATORY_CARE_PROVIDER_SITE_OTHER): Payer: Medicaid Other | Admitting: Ophthalmology

## 2017-12-19 ENCOUNTER — Ambulatory Visit (INDEPENDENT_AMBULATORY_CARE_PROVIDER_SITE_OTHER): Payer: Medicaid Other | Admitting: Ophthalmology

## 2017-12-19 DIAGNOSIS — H53002 Unspecified amblyopia, left eye: Secondary | ICD-10-CM

## 2017-12-19 DIAGNOSIS — H3581 Retinal edema: Secondary | ICD-10-CM

## 2017-12-19 DIAGNOSIS — H10502 Unspecified blepharoconjunctivitis, left eye: Secondary | ICD-10-CM

## 2017-12-19 DIAGNOSIS — H3322 Serous retinal detachment, left eye: Secondary | ICD-10-CM

## 2017-12-19 DIAGNOSIS — H209 Unspecified iridocyclitis: Secondary | ICD-10-CM | POA: Diagnosis not present

## 2017-12-19 NOTE — Progress Notes (Signed)
[facility] - Clinic Note  12/19/2017     CHIEF COMPLAINT Patient presents for Retina Follow Up   HISTORY OF PRESENT ILLNESS: Jaime Shelton is a 27 y.o. female who presents to the clinic today for:   HPI    Retina Follow Up    Patient presents with  Retinal Break/Detachment.  In left eye.  Severity is moderate.  Duration of 7 weeks.  Since onset it is stable.  I, the attending physician,  performed the HPI with the patient and updated documentation appropriately.          Comments    Patient here for Retina follow up. Patient states Vision been ok. Has had their moments but been ok.  Sometimes has eye pain, but not all the time.        Last edited by Rennis Chris, MD on 12/19/2017  3:10 PM. (History)    pt states she has eye pain occasionally, pt states she used the Inveltys for a week and a half as directed and then stopped  Referring physician: [facility], [facility] [address] [place], [place] [address]  HISTORICAL INFORMATION:   Selected notes from the MEDICAL RECORD NUMBER    CURRENT MEDICATIONS: No current outpatient medications on file. (Ophthalmic Drugs)   No current facility-administered medications for this visit.  (Ophthalmic Drugs)   Current Outpatient Medications (Other)  Medication Sig  . aspirin-acetaminophen-caffeine (EXCEDRIN MIGRAINE) 250-250-65 MG per tablet Take 1 tablet by mouth every 6 (six) hours as needed for headache.  . Aspirin-Acetaminophen-Caffeine (GOODY HEADACHE PO) Take 1 Package by mouth daily as needed (headache).  . doxycycline (VIBRA-TABS) 100 MG tablet Take 1 tablet (100 mg total) by mouth 2 (two) times daily.  . fluconazole (DIFLUCAN) 150 MG tablet Take 1 tablet (150 mg total) by mouth daily. Take second dose 72 hours later if symptoms still persists.  Marland Kitchen oxyCODONE-acetaminophen (PERCOCET/ROXICET) 5-325 MG per tablet Take 1 tablet by mouth every 6 (six) hours as needed for severe pain.   No current  facility-administered medications for this visit.  (Other)      REVIEW OF SYSTEMS:    ALLERGIES No Known Allergies  PAST MEDICAL HISTORY Past Medical History:  Diagnosis Date  . Abdominal pain    Hx: of  . Complication of anesthesia   . Headache(784.0)    Hx: of Migraines  . Heart murmur   . PONV (postoperative nausea and vomiting)   . Shortness of breath    Hx: of   Past Surgical History:  Procedure Laterality Date  . AXILLARY SURGERY  06/26/2012   cyst removed  . EAR CYST EXCISION Left 06/26/2012   Procedure: excison of left axillary CYST REMOVAL;  Surgeon: Lodema Pilot, DO;  Location: [facility];  Service: General;  Laterality: Left;  . NO PAST SURGERIES      FAMILY HISTORY Family History  Problem Relation Age of Onset  . HIV Father   . Hypertension Mother     SOCIAL HISTORY Social History   Tobacco Use  . Smoking status: Never Smoker  . Smokeless tobacco: Never Used  Substance Use Topics  . Alcohol use: Yes    Comment: occasional  . Drug use: No         OPHTHALMIC EXAM:  Base Eye Exam    Visual Acuity (Snellen - Linear)      Right Left   Dist [place] 20/20 20/800   Dist ph [place]  20/80       Tonometry (  Tonopen, 2:35 PM)      Right Left   Pressure 20 17       Pupils      Dark Light Shape React APD   Right 4 3 Round Brisk None   Left 4 3 Round Brisk None       Visual Fields (Counting fingers)      Left Right     Full       Extraocular Movement      Right Left    Full, Ortho Full, Ortho       Neuro/Psych    Oriented x3:  Yes   Mood/Affect:  Normal       Dilation    Both eyes:  1.0% Mydriacyl, 2.5% Phenylephrine @ 2:35 PM        Slit Lamp and Fundus Exam    Slit Lamp Exam      Right Left   Lids/Lashes +MGD Normal   Conjunctiva/Sclera melanosis mild Melanosis   Cornea Clear Clear   Anterior Chamber Deep and quiet Deep   Iris Round and reactive Round and dilated   Lens Clear Clear   Vitreous Normal Vitreous syneresis        Fundus Exam      Right Left   Disc Pink and Sharp tilted; temporal PPA   C/D Ratio 0.3 0.4   Macula flat; good foveal reflex flat; blunted foveal reflex; RPE mottling and clumping   Vessels Normal Normal   Periphery attached inf temporal SRF/RD from 0400-0600 with thin atrophic retina - good laser surrounding; otherwise attached          IMAGING AND PROCEDURES  Imaging and Procedures for 10/29/17  OCT, Retina - OU - Both Eyes       Right Eye Quality was good. Central Foveal Thickness: 210. Progression has been stable. Findings include normal foveal contour, no IRF, no SRF.   Left Eye Quality was good. Central Foveal Thickness: 266. Progression has been stable. Findings include myopic contour, outer retinal atrophy, normal foveal contour, no IRF, no SRF.   Notes *Images captured and stored on drive  Diagnosis / Impression:  OD: NFP, No IRF/SRF OS: peripheral SRF inferiorly -- chronic inf RD -- atrophic retina, caught on widefield  Clinical management:  See below  Abbreviations: NFP - Normal foveal profile. CME - cystoid macular edema. PED - pigment epithelial detachment. IRF - intraretinal fluid. SRF - subretinal fluid. EZ - ellipsoid zone. ERM - epiretinal membrane. ORA - outer retinal atrophy. ORT - outer retinal tubulation. SRHM - subretinal hyper-reflective material         Color Fundus Photography Optos - OU - Both Eyes       Right Eye Progression has been stable. Disc findings include normal observations. Macula : normal observations. Vessels : normal observations. Periphery : normal observations.   Left Eye Progression has improved. Disc findings include pallor. Macula : normal observations, retinal pigment epithelium abnormalities. Vessels : normal observations, attenuated. Periphery : detachment (Good laser changes surrounding inferior RD from 4-6 oclock).   Notes **Images stored on drive**                ASSESSMENT/PLAN:    ICD-10-CM   1. Left  retinal detachment H33.22 Color Fundus Photography Optos - OU - Both Eyes  2. 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 Blepharoconjunctivitis of left eye, unspecified blepharoconjunctivitis type H10.502   4. Amblyopia, left eye H53.002   5. 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 Chronic inferior retinal detachment, OS  - inf temporal SRF/RD - s/p laser retinopexy OS (09.27.19) -- good barricade laser surrounding RD to ora - Optos photos obtained today (11.12.19) for comparison - F/U 2 months  2,3. Mild iritis and blepharoconjunctivitis OS -- resolved - subjectively improved with no flares since last visit - AC cell/pigment resolved - lid edema and conj injection resolved  4.  Amblyopia OS - pt reports history of patching as a child - monitor  Ophthalmic Meds Ordered this visit:  No orders of the defined types were placed in this encounter.      Return in about 2 months (around 02/18/2018) for RD OS, DFE, OCT.  There are no Patient Instructions on file for this visit.   Explained the diagnoses, plan, and follow up with the patient and they expressed understanding.  Patient expressed understanding of the importance of proper follow up care.   This document serves as a record of services personally performed by Karie ChimeraBrian G. Jentri Aye, MD, PhD. It was created on their behalf by Laurian BrimAmanda Brown, OA, an ophthalmic assistant. The creation of this record is the provider's dictation and/or activities during the visit.    Electronically signed by: Laurian BrimAmanda Brown, OA  11.12.19 1:49 PM    Karie ChimeraBrian G. Deronda Christian, M.D., Ph.D. Diseases & Surgery of the Retina and Vitreous [facility]  I have reviewed the above documentation for accuracy and completeness, and I agree with the above. Karie ChimeraBrian G. Matasha Smigelski, M.D., Ph.D. 12/20/17 1:49 PM     Abbreviations: M myopia (nearsighted); A astigmatism; H  hyperopia (farsighted); P presbyopia; Mrx spectacle prescription;  CTL contact lenses; OD right eye; OS left eye; OU both eyes  XT exotropia; ET esotropia; PEK punctate epithelial keratitis; PEE punctate epithelial erosions; DES dry eye syndrome; MGD meibomian gland dysfunction; ATs artificial tears; PFAT's preservative free artificial tears; NSC nuclear sclerotic cataract; PSC posterior subcapsular cataract; ERM epi-retinal membrane; PVD posterior vitreous detachment; RD retinal detachment; DM diabetes mellitus; DR diabetic retinopathy; NPDR non-proliferative diabetic retinopathy; PDR proliferative diabetic retinopathy; CSME clinically significant macular edema; DME diabetic macular edema; dbh dot blot hemorrhages; CWS cotton wool spot; POAG primary open angle glaucoma; C/D cup-to-disc ratio; HVF humphrey visual field; GVF goldmann visual field; OCT optical coherence tomography; IOP intraocular pressure; BRVO Branch retinal vein occlusion; CRVO central retinal vein occlusion; CRAO central retinal artery occlusion; BRAO branch retinal artery occlusion; RT retinal tear; SB scleral buckle; PPV pars plana vitrectomy; VH Vitreous hemorrhage; PRP panretinal laser photocoagulation; IVK intravitreal kenalog; VMT vitreomacular traction; MH Macular hole;  NVD neovascularization of the disc; NVE neovascularization elsewhere; AREDS age related eye disease study; ARMD age related macular degeneration; POAG primary open angle glaucoma; EBMD epithelial/anterior basement membrane dystrophy; ACIOL anterior chamber intraocular lens; IOL intraocular lens; PCIOL posterior chamber intraocular lens; Phaco/IOL phacoemulsification with intraocular lens placement; PRK photorefractive keratectomy; LASIK laser assisted in situ keratomileusis; HTN hypertension; DM diabetes mellitus; COPD chronic obstructive pulmonary disease

## 2017-12-20 ENCOUNTER — Encounter (INDEPENDENT_AMBULATORY_CARE_PROVIDER_SITE_OTHER): Payer: Self-pay | Admitting: Ophthalmology

## 2017-12-31 ENCOUNTER — Emergency Department (HOSPITAL_COMMUNITY): Payer: Medicaid Other

## 2017-12-31 ENCOUNTER — Encounter (HOSPITAL_COMMUNITY): Payer: Self-pay | Admitting: Emergency Medicine

## 2017-12-31 ENCOUNTER — Emergency Department (HOSPITAL_COMMUNITY)
Admission: EM | Admit: 2017-12-31 | Discharge: 2017-12-31 | Disposition: A | Discharge status: Home / Self Care | Payer: Medicaid Other | Attending: Emergency Medicine | Admitting: Emergency Medicine

## 2017-12-31 ENCOUNTER — Other Ambulatory Visit: Payer: Self-pay

## 2017-12-31 DIAGNOSIS — Z79899 Other long term (current) drug therapy: Secondary | ICD-10-CM | POA: Diagnosis not present

## 2017-12-31 DIAGNOSIS — R110 Nausea: Secondary | ICD-10-CM | POA: Insufficient documentation

## 2017-12-31 DIAGNOSIS — F1290 Cannabis use, unspecified, uncomplicated: Secondary | ICD-10-CM | POA: Diagnosis not present

## 2017-12-31 DIAGNOSIS — R1013 Epigastric pain: Secondary | ICD-10-CM

## 2017-12-31 DIAGNOSIS — Z5320 Procedure and treatment not carried out because of patient's decision for unspecified reasons: Secondary | ICD-10-CM | POA: Insufficient documentation

## 2017-12-31 MED ORDER — ONDANSETRON HCL 4 MG/2ML IJ SOLN: 4.0000 mg | Freq: Once | INTRAMUSCULAR | Status: DC

## 2017-12-31 MED ORDER — LIDOCAINE VISCOUS HCL 2 % MT SOLN: 15.0000 mL | Freq: Once | OROMUCOSAL | Status: DC

## 2017-12-31 MED ORDER — ALUM & MAG HYDROXIDE-SIMETH 200-200-20 MG/5ML PO SUSP: 30.0000 mL | Freq: Once | ORAL | Status: DC

## 2017-12-31 NOTE — ED Triage Notes (Signed)
Pt presents with sudden onset of epigastric pain with radiation to left flank x2 hours ago. Patient states nausea but no vomiting. Denies diarrhea and urinary symptoms. Patient states she took a gas pill and a tramadol with no relief.

## 2017-12-31 NOTE — ED Notes (Addendum)
Patient states unable to urinate at this time.

## 2017-12-31 NOTE — ED Provider Notes (Signed)
[facility]-EMERGENCY DEPT Provider Note   CSN: [id number] Arrival date & time: 12/31/17  0135     History   Chief Complaint Chief Complaint  Patient presents with  . Abdominal Pain    HPI Jaime Shelton is a 27 y.o. female.  The history is provided by the patient and medical records.  Abdominal Pain   Associated symptoms include nausea.     26 year old female with history of heart murmur, presenting to the ED with epigastric abdominal pain.  States this began about 2 hours ago.  States her abdomen feels bloated and "like pressure".  She has had some some nausea but denies vomiting.  No diarrhea.  No urinary symptoms or pelvic pain.  States pain initially just epigastric, now all throughout her upper abdomen and a little bit into her back.  She denies any chest pain or shortness of breath.  She did try taking some over-the-counter gas medicine at home and drinking a soda which helped her belch a little bit with some mild relief but still with significant pain.  She then tried a tramadol which did not help at all.  No prior abdominal surgeries.  Past Medical History:  Diagnosis Date  . Abdominal pain    Hx: of  . Complication of anesthesia   . Headache(784.0)    Hx: of Migraines  . Heart murmur   . PONV (postoperative nausea and vomiting)   . Shortness of breath    Hx: of    There are no active problems to display for this patient.   Past Surgical History:  Procedure Laterality Date  . AXILLARY SURGERY  06/26/2012   cyst removed  . EAR CYST EXCISION Left 06/26/2012   Procedure: excison of left axillary CYST REMOVAL;  Surgeon: Lodema Pilot, DO;  Location: [facility] OR;  Service: General;  Laterality: Left;  . NO PAST SURGERIES       OB History   None      Home Medications    Prior to Admission medications   Medication Sig Start Date End Date Taking? Authorizing Provider  aspirin-acetaminophen-caffeine (EXCEDRIN MIGRAINE) [phone] MG per tablet  Take 1 tablet by mouth every 6 (six) hours as needed for headache. 07/15/14   Lurene Shadow, PA-C  Aspirin-Acetaminophen-Caffeine (GOODY HEADACHE PO) Take 1 Package by mouth daily as needed (headache).    [provider]  doxycycline (VIBRA-TABS) 100 MG tablet Take 1 tablet (100 mg total) by mouth 2 (two) times daily. 04/02/14   Elwin Mocha, MD  fluconazole (DIFLUCAN) 150 MG tablet Take 1 tablet (150 mg total) by mouth daily. Take second dose 72 hours later if symptoms still persists. 01/22/17   Cathie Hoops, Amy V, PA-C  oxyCODONE-acetaminophen (PERCOCET/ROXICET) 5-325 MG per tablet Take 1 tablet by mouth every 6 (six) hours as needed for severe pain. 04/02/14   Elwin Mocha, MD    Family History Family History  Problem Relation Age of Onset  . HIV Father   . Hypertension Mother     Social History Social History   Tobacco Use  . Smoking status: Never Smoker  . Smokeless tobacco: Never Used  Substance Use Topics  . Alcohol use: Yes    Comment: occasional  . Drug use: Yes    Types: Marijuana    Comment: Daily     Allergies   Patient has no known allergies.   Review of Systems Review of Systems  Gastrointestinal: Positive for abdominal pain and nausea.  All other systems reviewed and  are negative.    Physical Exam Updated Vital Signs BP 133/84 (BP Location: Right Arm)   Pulse 92   Temp 97.7 F (36.5 C) (Oral)   Resp 18   Ht 5\' 5"  (1.651 m)   Wt 49.9 kg   SpO2 100%   BMI 18.30 kg/m   Physical Exam  Constitutional: She is oriented to person, place, and time. She appears well-developed and well-nourished.  unbuttoned pants and bra, states she feels bloated  HENT:  Head: Normocephalic and atraumatic.  Mouth/Throat: Oropharynx is clear and moist.  Eyes: Pupils are equal, round, and reactive to light. Conjunctivae and EOM are normal.  Neck: Normal range of motion.  Cardiovascular: Normal rate, regular rhythm and normal heart sounds.  Pulmonary/Chest: Effort normal  and breath sounds normal.  Abdominal: Soft. Bowel sounds are normal. There is tenderness. There is no rigidity and no guarding.  Mildly tender all across upper abdomen, mostly in LUQ, no peritoneal signs  Musculoskeletal: Normal range of motion.  Neurological: She is alert and oriented to person, place, and time.  Skin: Skin is warm and dry.  Psychiatric: She has a normal mood and affect.  Nursing note and vitals reviewed.    ED Treatments / Results  Labs (all labs ordered are listed, but only abnormal results are displayed) Labs Reviewed  LIPASE, BLOOD  COMPREHENSIVE METABOLIC PANEL  CBC  URINALYSIS, ROUTINE W REFLEX MICROSCOPIC  I-STAT BETA HCG BLOOD, ED ([facility], [facility], [facility] ONLY)    EKG None  Radiology No results found.  Procedures Procedures (including critical care time)  Medications Ordered in ED Medications  ondansetron (ZOFRAN) injection 4 mg (has no administration in time range)  alum & mag hydroxide-simeth (MAALOX/MYLANTA) 200-200-20 MG/5ML suspension 30 mL (has no administration in time range)    And  lidocaine (XYLOCAINE) 2 % viscous mouth solution 15 mL (has no administration in time range)     Initial Impression / Assessment and Plan / ED Course  I have reviewed the triage vital signs and the nursing notes.  Pertinent labs & imaging results that were available during my care of the patient were reviewed by me and considered in my medical decision making (see chart for details).  27 y.o. F here with epigastric pain x2 days.  Initially only epigastric, now all across her upper abdomen.  She is afebrile and nontoxic in appearance.  Does have some mild tenderness on exam, more so on her left upper quadrant.  She does report taking some gas medicine at home with a little bit of belching which improved but did not completely resolve her pain.  She denies any acid reflux type symptoms.  After history and physical, feel this is likely GI related.  She has unbutton her pants  and taken off her bra because she states she feels "bloated".  Feel this is likely gas pains.  Labs and ultrasound ordered as well as GI cocktail and Zofran.  2:59 AM Notified by RN that patient was seen walking out.  She reported to RN that she burped in the room and felt better so she decided to leave.  She did not have any of her bloodwork or imaging studies performed.  Patient left AMA.  Final Clinical Impressions(s) / ED Diagnoses   Final diagnoses:  Epigastric pain    ED Discharge Orders    None       Garlon HatchetSanders, Egor Fullilove M, PA-C 12/31/17 0308    Shon BatonHorton, Courtney F, MD 12/31/17 [phone]

## 2017-12-31 NOTE — ED Notes (Signed)
Patient stated she "felt better" after burping and left ED

## 2018-01-01 ENCOUNTER — Inpatient Hospital Stay (HOSPITAL_COMMUNITY)
Admission: AD | Admit: 2018-01-01 | Discharge: 2018-01-01 | Disposition: A | Discharge status: Home / Self Care | Payer: Medicaid Other | Source: Ambulatory Visit | Attending: Obstetrics and Gynecology | Admitting: Obstetrics and Gynecology

## 2018-01-01 ENCOUNTER — Other Ambulatory Visit: Payer: Self-pay

## 2018-01-01 ENCOUNTER — Encounter (HOSPITAL_COMMUNITY): Payer: Self-pay | Admitting: *Deleted

## 2018-01-01 DIAGNOSIS — R197 Diarrhea, unspecified: Secondary | ICD-10-CM | POA: Diagnosis not present

## 2018-01-01 DIAGNOSIS — R51 Headache: Secondary | ICD-10-CM | POA: Diagnosis not present

## 2018-01-01 DIAGNOSIS — Z79899 Other long term (current) drug therapy: Secondary | ICD-10-CM | POA: Insufficient documentation

## 2018-01-01 DIAGNOSIS — R110 Nausea: Secondary | ICD-10-CM

## 2018-01-01 DIAGNOSIS — R109 Unspecified abdominal pain: Secondary | ICD-10-CM | POA: Diagnosis present

## 2018-01-01 DIAGNOSIS — N946 Dysmenorrhea, unspecified: Secondary | ICD-10-CM | POA: Diagnosis not present

## 2018-01-01 DIAGNOSIS — Z9889 Other specified postprocedural states: Secondary | ICD-10-CM | POA: Insufficient documentation

## 2018-01-01 LAB — URINALYSIS, ROUTINE W REFLEX MICROSCOPIC
Bilirubin Urine: NEGATIVE
Glucose, UA: NEGATIVE mg/dL
Ketones, ur: 20 mg/dL — AB
Nitrite: NEGATIVE
PH: 6 (ref 5.0–8.0)
PROTEIN: 30 mg/dL — AB
RBC / HPF: >50 RBC/hpf — ABNORMAL HIGH (ref 0–5)
Specific Gravity, Urine: 1.027 (ref 1.005–1.030)

## 2018-01-01 LAB — POCT PREGNANCY, URINE: PREG TEST UR: NEGATIVE

## 2018-01-01 MED ORDER — IBUPROFEN 600 MG PO TABS: 600.0000 mg | ORAL_TABLET | Freq: Four times a day (QID) | ORAL | 0 refills | Status: DC | PRN

## 2018-01-01 MED ORDER — KETOROLAC TROMETHAMINE 60 MG/2ML IM SOLN
60.0000 mg | Freq: Once | INTRAMUSCULAR | Status: AC
  Administered 2018-01-01: 60 mg via INTRAMUSCULAR
  Filled 2018-01-01: qty 2

## 2018-01-01 MED ORDER — ONDANSETRON 4 MG PO TBDP: 4.0000 mg | ORAL_TABLET | Freq: Three times a day (TID) | ORAL | 0 refills | Status: DC | PRN

## 2018-01-01 MED ORDER — TRAMADOL HCL 50 MG PO TABS: 50.0000 mg | ORAL_TABLET | Freq: Four times a day (QID) | ORAL | 0 refills | Status: DC | PRN

## 2018-01-01 NOTE — MAU Note (Signed)
Been having really bad stomach pains today.  Pain is in lower abd around to the back.  Period was late.  [facility] as [facility].  Pain was in upper abd.  Most of the pain is on the left side.  Started bleeding this morning, is light.  Loose watery stool started this morning.

## 2018-01-01 NOTE — Discharge Instructions (Signed)
In late February 2020, the [facility] will be [facility]. At that time, the MAU (Maternity Admissions Unit), where you are being seen today, will no longer take care of non-pregnant patients. We strongly encourage you to find a doctor's office before that time, so that you can be seen with any GYN concerns, like vaginal discharge, urinary tract infection, etc.. in a timely manner.  In order to make an office visit more convenient, the Center for [facility] at [facility] will be offering evening hours with same-day appointments, walk-in appointments and scheduled appointments available during this time.  Center for [facility] @ [facility] Hours: Monday - 8am - 7:30 pm with walk-in between 4pm- 7:30 pm Tuesday - 8 am - 5 pm (starting 05/09/17 we will be open late and accepting walk-ins from 4pm - 7:30pm) Wednesday - 8 am - 5 pm (starting 08/09/17 we will be open late and accepting walk-ins from 4pm - 7:30pm) Thursday 8 am - 5 pm (starting 11/09/17 we will be open late and accepting walk-ins from 4pm - 7:30pm) Friday 8 am - 12 pm  For an appointment please call the Center for [facility] at [phone]  For urgent needs, [facility] is also available for management of urgent GYN complaints such as vaginal discharge or urinary tract infections.

## 2018-01-01 NOTE — MAU Provider Note (Signed)
History     CSN: [id number]  Arrival date and time: 01/01/18 1758   First Provider Initiated Contact with Patient 01/01/18 1905      Chief Complaint  Patient presents with  . Abdominal Pain   HPI  Ms.  Jaime Shelton is a 27 y.o. year old G1P1 non-pregnant female who presents to MAU reporting bad stomach pains, lower abdominal pain that goes around to back, late period, upper abdominal pain, light VB this AM, and loose watery stool this AM x 5 episodes. She went to [facility], but left, "because she started feeling better." She reports her LMP 11/04/2017.  Past Medical History:  Diagnosis Date  . Abdominal pain    Hx: of  . Complication of anesthesia   . Headache(784.0)    Hx: of Migraines  . Heart murmur   . PONV (postoperative nausea and vomiting)   . Shortness of breath    Hx: of    Past Surgical History:  Procedure Laterality Date  . AXILLARY SURGERY  06/26/2012   cyst removed  . EAR CYST EXCISION Left 06/26/2012   Procedure: excison of left axillary CYST REMOVAL;  Surgeon: Lodema Pilot, DO;  Location: [facility] OR;  Service: General;  Laterality: Left;  . NO PAST SURGERIES    . REFRACTIVE SURGERY      Family History  Problem Relation Age of Onset  . HIV Father   . Hypertension Mother     Social History   Tobacco Use  . Smoking status: Never Smoker  . Smokeless tobacco: Never Used  Substance Use Topics  . Alcohol use: Yes    Comment: occasional  . Drug use: Yes    Types: Marijuana    Comment: Daily    Allergies: No Known Allergies  Medications Prior to Admission  Medication Sig Dispense Refill Last Dose  . aspirin-acetaminophen-caffeine (EXCEDRIN MIGRAINE) 250-250-65 MG per tablet Take 1 tablet by mouth every 6 (six) hours as needed for headache. 30 tablet 0 Taking  . Aspirin-Acetaminophen-Caffeine (GOODY HEADACHE PO) Take 1 Package by mouth daily as needed (headache).   Taking  . doxycycline (VIBRA-TABS) 100 MG tablet Take 1 tablet (100 mg total) by mouth 2  (two) times daily. 28 tablet 0 Taking  . fluconazole (DIFLUCAN) 150 MG tablet Take 1 tablet (150 mg total) by mouth daily. Take second dose 72 hours later if symptoms still persists. 2 tablet 0 Taking  . oxyCODONE-acetaminophen (PERCOCET/ROXICET) 5-325 MG per tablet Take 1 tablet by mouth every 6 (six) hours as needed for severe pain. 20 tablet 0 Taking    Review of Systems  Constitutional: Negative.   HENT: Negative.   Eyes: Negative.   Respiratory: Negative.   Cardiovascular: Negative.   Gastrointestinal: Positive for abdominal pain.  Endocrine: Negative.   Genitourinary: Positive for menstrual problem ("late") and vaginal bleeding ("pink spotting this AM").  Musculoskeletal: Positive for back pain.  Skin: Negative.   Allergic/Immunologic: Negative.   Neurological: Negative.   Hematological: Negative.   Psychiatric/Behavioral: Negative.    Physical Exam   Blood pressure (!) 143/96, pulse 68, temperature 98.5 F (36.9 C), temperature source Oral, resp. rate 18, weight 50.9 kg, last menstrual period 11/04/2017, SpO2 100 %.  Physical Exam  Nursing note and vitals reviewed. Constitutional: She is oriented to person, place, and time. She appears well-developed and well-nourished.  HENT:  Head: Normocephalic and atraumatic.  Eyes: Pupils are equal, round, and reactive to light.  Neck: Normal range of motion.  Cardiovascular: Normal rate.  Respiratory: Effort  normal and breath sounds normal.  GI: Soft. Bowel sounds are normal.  Musculoskeletal: Normal range of motion.  Neurological: She is alert and oriented to person, place, and time.  Skin: Skin is warm and dry.  Psychiatric: She has a normal mood and affect. Her behavior is normal. Judgment and thought content normal.    MAU Course  Procedures  MDM CC[facility] UPT Toradol 60 mg IM -- "feels better and ready to go home" Saltine crackers and gingerale -- improved nausea  Results for orders placed or performed during the  hospital encounter of 01/01/18 (from the past 24 hour(s))  Pregnancy, urine POC     Status: None   Collection Time: 01/01/18  6:56 PM  Result Value Ref Range   Preg Test, Ur NEGATIVE NEGATIVE    Assessment and Plan  Dysmenorrhea - Reassurance that abd pain today is from menses - Information provided on dysmenorrhea - Rx for Ultram 50 mg every 6 hrs prn pain   Diarrhea of presumed infectious origin - Information provided on diarrhea & food choices to help diarrhea - Advised to drink plenty of water   Nausea without vomiting  - Information provided on N/V - Rx for Zofran 8 mg ODT sent   - Discharge patient - F/U with [facility] prn - Patient verbalized an understanding of the plan of care and agrees.    Raelyn Moraolitta Lela Murfin, MSN, CNM 01/01/2018, 7:05 PM

## 2018-02-19 ENCOUNTER — Encounter (INDEPENDENT_AMBULATORY_CARE_PROVIDER_SITE_OTHER): Payer: Medicaid Other | Admitting: Ophthalmology

## 2018-04-20 ENCOUNTER — Ambulatory Visit (HOSPITAL_COMMUNITY)
Admission: EM | Admit: 2018-04-20 | Discharge: 2018-04-20 | Disposition: A | Discharge status: Home / Self Care | Payer: Medicaid Other | Attending: Family Medicine | Admitting: Family Medicine

## 2018-04-20 ENCOUNTER — Other Ambulatory Visit: Payer: Self-pay

## 2018-04-20 DIAGNOSIS — L089 Local infection of the skin and subcutaneous tissue, unspecified: Secondary | ICD-10-CM

## 2018-04-20 DIAGNOSIS — L720 Epidermal cyst: Secondary | ICD-10-CM

## 2018-04-20 MED ORDER — LIDOCAINE HCL (PF) 1 % IJ SOLN
INTRAMUSCULAR | Status: AC
  Filled 2018-04-20: qty 30

## 2018-04-20 MED ORDER — TRAMADOL HCL 50 MG PO TABS: 50.0000 mg | ORAL_TABLET | Freq: Four times a day (QID) | ORAL | 0 refills | Status: DC | PRN

## 2018-04-20 MED ORDER — SULFAMETHOXAZOLE-TRIMETHOPRIM 800-160 MG PO TABS: 1.0000 | ORAL_TABLET | Freq: Two times a day (BID) | ORAL | 0 refills | Status: AC

## 2018-04-20 NOTE — ED Triage Notes (Signed)
Per pt she had a cyst removed about 5 years ago in her left under arm and has noticed a half dollar size knot that has come up there. No fevers or chill, just very painful to touch.

## 2018-04-20 NOTE — ED Provider Notes (Signed)
[facility]    CSN: [id number] Arrival date & time: 04/20/18  6389     History   Chief Complaint Chief Complaint  Patient presents with  . Abscess    HPI Jaime Shelton is a 28 y.o. female.   HPI  Patient is here for an abscess under her left armpit.  She states that she previously had a large cyst in this armpit removed.  The abscess has grown over the last week, and the scar underneath where the prior cyst was.  It is very painful.  She is here to have this managed. Patient states she is in good health and on no medications at this time.   Past Medical History:  Diagnosis Date  . Abdominal pain    Hx: of  . Complication of anesthesia   . Headache(784.0)    Hx: of Migraines  . Heart murmur   . PONV (postoperative nausea and vomiting)   . Shortness of breath    Hx: of    There are no active problems to display for this patient.   Past Surgical History:  Procedure Laterality Date  . AXILLARY SURGERY  06/26/2012   cyst removed  . EAR CYST EXCISION Left 06/26/2012   Procedure: excison of left axillary CYST REMOVAL;  Surgeon: Lodema Pilot, DO;  Location: [facility] OR;  Service: General;  Laterality: Left;  . NO PAST SURGERIES    . REFRACTIVE SURGERY      OB History    Gravida  1   Para  1   Term      Preterm      AB      Living  1     SAB      TAB      Ectopic      Multiple      Live Births               Home Medications    Prior to Admission medications   Medication Sig Start Date End Date Taking? Authorizing Provider  Aspirin-Acetaminophen-Caffeine (GOODY HEADACHE PO) Take 1 Package by mouth daily as needed (headache).    [provider]  sulfamethoxazole-trimethoprim (BACTRIM DS,SEPTRA DS) 800-160 MG tablet Take 1 tablet by mouth 2 (two) times daily for 7 days. 04/20/18 04/27/18  Eustace Moore, MD  traMADol (ULTRAM) 50 MG tablet Take 1 tablet (50 mg total) by mouth every 6 (six) hours as needed for severe pain.  04/20/18   Eustace Moore, MD    Family History Family History  Problem Relation Age of Onset  . HIV Father   . Hypertension Mother     Social History Social History   Tobacco Use  . Smoking status: Never Smoker  . Smokeless tobacco: Never Used  Substance Use Topics  . Alcohol use: Yes    Comment: occasional  . Drug use: Yes    Types: Marijuana    Comment: Daily     Allergies   Patient has no known allergies.   Review of Systems Review of Systems  Constitutional: Negative for chills and fever.  HENT: Negative for ear pain and sore throat.   Eyes: Negative for pain and visual disturbance.  Respiratory: Negative for cough and shortness of breath.   Cardiovascular: Negative for chest pain and palpitations.  Gastrointestinal: Negative for abdominal pain and vomiting.  Genitourinary: Negative for dysuria and hematuria.  Musculoskeletal: Negative for arthralgias and back pain.  Skin: Positive for wound. Negative for color change and  rash.  Neurological: Negative for seizures and syncope.  All other systems reviewed and are negative.    Physical Exam Triage Vital Signs ED Triage Vitals  Enc Vitals Group     BP 04/20/18 1934 (!) 135/92     Pulse Rate 04/20/18 1934 90     Resp 04/20/18 1934 16     Temp 04/20/18 1934 98.6 F (37 C)     Temp Source 04/20/18 1934 Oral     SpO2 04/20/18 1934 100 %     Weight 04/20/18 1938 114 lb (51.7 kg)     Height 04/20/18 1938 5\' 4"  (1.626 m)     Head Circumference --      Peak Flow --      Pain Score 04/20/18 1937 9     Pain Loc --      Pain Edu? --      Excl. in GC? --    No data found.  Updated Vital Signs BP (!) 135/92 (BP Location: Right Arm)   Pulse 90   Temp 98.6 F (37 C) (Oral)   Resp 16   Ht 5\' 4"  (1.626 m)   Wt 51.7 kg   LMP 03/27/2018   SpO2 100%   BMI 19.57 kg/m   Visual Acuity Right Eye Distance:   Left Eye Distance:   Bilateral Distance:    Right Eye Near:   Left Eye Near:    Bilateral  Near:     Physical Exam Constitutional:      General: She is not in acute distress.    Appearance: She is well-developed.  HENT:     Head: Normocephalic and atraumatic.  Eyes:     Conjunctiva/sclera: Conjunctivae normal.     Pupils: Pupils are equal, round, and reactive to light.  Neck:     Musculoskeletal: Normal range of motion.  Cardiovascular:     Rate and Rhythm: Normal rate.  Pulmonary:     Effort: Pulmonary effort is normal. No respiratory distress.  Abdominal:     General: There is no distension.     Palpations: Abdomen is soft.  Musculoskeletal: Normal range of motion.  Skin:    General: Skin is warm and dry.     Comments: In the center of the left axilla there is a 2 x 2 and half centimeter mass, is in the center of a scar, there is a punctum in the center  Neurological:     Mental Status: She is alert.      UC Treatments / Results  Labs (all labs ordered are listed, but only abnormal results are displayed) Labs Reviewed - No data to display  EKG None  Radiology No results found.  Procedures Incision and Drainage Date/Time: 04/20/2018 9:02 PM Performed by: Eustace MooreNelson, Saleah Rishel Sue, MD Authorized by: Eustace MooreNelson, Makenli Derstine Sue, MD   Consent:    Consent obtained:  Verbal   Consent given by:  Patient   Risks discussed:  Bleeding, incomplete drainage and pain   Alternatives discussed:  No treatment and delayed treatment Location:    Type:  Abscess   Size:  2.5   Location:  Upper extremity   Upper extremity location: Axilla Left. Pre-procedure details:    Skin preparation:  Betadine Anesthesia (see MAR for exact dosages):    Anesthesia method:  Local infiltration   Local anesthetic:  Lidocaine 1% WITH epi Procedure type:    Complexity:  Simple Procedure details:    Needle aspiration: no     Incision types:  Stab incision   Scalpel blade:  11   Wound management:  Probed and deloculated   Drainage:  Purulent   Drainage amount:  Scant   Wound treatment:   Wound left open   Packing materials:  None Post-procedure details:    Patient tolerance of procedure:  Procedure terminated at patient's request Comments:     Patient did not tolerate well.  She did not tolerate the initial injection with lidocaine, complained that she got no anesthesia.  Complained of pain during the entire procedure.  Cried and cursed.  She did not allow to loculation.  She did not allow pressure or evacuation.  Procedure was aborted due to her intolerance of even touching the skin.  Note there is visible blanching of the abscess and surrounding tissues from the amount of lidocaine used.   (including critical care time)  Medications Ordered in UC Medications - No data to display  Initial Impression / Assessment and Plan / UC Course  I have reviewed the triage vital signs and the nursing notes.  Pertinent labs & imaging results that were available during my care of the patient were reviewed by me and considered in my medical decision making (see chart for details).    Discussed that this may do well with partial drainage.  Recommend he take the antibiotic and use warm compresses, pain med as needed.  If she comes back for another procedure, I recommend she pre med with a tramadol.  Evidence of symptom magnification. Final Clinical Impressions(s) / UC Diagnoses   Final diagnoses:  Infected epithelial inclusion cyst     Discharge Instructions     Take antibiotic 2 times a day Need to start antibiotic tonight Take pain medicine every 4 hours as needed Take with food Warm compresses to area for 20 minutes every couple of hours Return if area needs additional care   ED Prescriptions    Medication Sig Dispense Auth. Provider   traMADol (ULTRAM) 50 MG tablet Take 1 tablet (50 mg total) by mouth every 6 (six) hours as needed for severe pain. 12 tablet Eustace Moore, MD   sulfamethoxazole-trimethoprim (BACTRIM DS,SEPTRA DS) 800-160 MG tablet Take 1 tablet by mouth  2 (two) times daily for 7 days. 14 tablet Eustace Moore, MD     Controlled Substance Prescriptions  Controlled Substance Registry consulted? Not Applicable   Eustace Moore, MD 04/20/18 2107

## 2018-04-20 NOTE — Discharge Instructions (Addendum)
Take antibiotic 2 times a day Need to start antibiotic tonight Take pain medicine every 4 hours as needed Take with food Warm compresses to area for 20 minutes every couple of hours Return if area needs additional care

## 2018-06-01 ENCOUNTER — Other Ambulatory Visit: Payer: Self-pay

## 2018-06-01 ENCOUNTER — Encounter (INDEPENDENT_AMBULATORY_CARE_PROVIDER_SITE_OTHER): Payer: Self-pay | Admitting: Ophthalmology

## 2018-06-01 ENCOUNTER — Ambulatory Visit (INDEPENDENT_AMBULATORY_CARE_PROVIDER_SITE_OTHER): Payer: Medicaid Other | Admitting: Ophthalmology

## 2018-06-01 DIAGNOSIS — H209 Unspecified iridocyclitis: Secondary | ICD-10-CM

## 2018-06-01 DIAGNOSIS — H10501 Unspecified blepharoconjunctivitis, right eye: Secondary | ICD-10-CM

## 2018-06-01 DIAGNOSIS — H3322 Serous retinal detachment, left eye: Secondary | ICD-10-CM | POA: Diagnosis not present

## 2018-06-01 DIAGNOSIS — H53002 Unspecified amblyopia, left eye: Secondary | ICD-10-CM | POA: Diagnosis not present

## 2018-06-01 DIAGNOSIS — H3581 Retinal edema: Secondary | ICD-10-CM

## 2018-06-01 NOTE — Progress Notes (Addendum)
[facility] - Clinic Note  06/01/2018     CHIEF COMPLAINT Patient presents for Eye Pain   HISTORY OF PRESENT ILLNESS: Jaime Shelton is a 28 y.o. female who presents to the clinic today for:   HPI    Eye Pain      In right eye.  Characterized as aching.  Pain was noted as 8/10.  Occurring constantly.  Duration of days.  Associated symptoms include blurred vision.  I, the attending physician,  performed the HPI with the patient and updated documentation appropriately.          Comments    Patient states her vision is blurry in her right eye and has had pain and light sensitivity in the last 24 hours OD.       Last edited by Posey BoyerBrown, Amanda J, COT on 06/01/2018  9:38 AM. (History)    pt states her right eye has been red since yesterday, she states she noticed it at first when she woke up, she states she brushed it off to begin with and went to work as normal, but her eye got more sensitive throughout the day, she states it is sensitive to light and aching   Referring physician: [facility] [address] [place], [place] [id number]  HISTORICAL INFORMATION:   Selected notes from the MEDICAL RECORD NUMBER    CURRENT MEDICATIONS: No current outpatient medications on file. (Ophthalmic Drugs)   No current facility-administered medications for this visit.  (Ophthalmic Drugs)   Current Outpatient Medications (Other)  Medication Sig  . Aspirin-Acetaminophen-Caffeine (GOODY HEADACHE PO) Take 1 Package by mouth daily as needed (headache).  . traMADol (ULTRAM) 50 MG tablet Take 1 tablet (50 mg total) by mouth every 6 (six) hours as needed for severe pain.   No current facility-administered medications for this visit.  (Other)      REVIEW OF SYSTEMS: ROS    Positive for: Eyes   Negative for: Constitutional, Gastrointestinal, Neurological, Skin, Genitourinary, Musculoskeletal, HENT, Endocrine, Cardiovascular, Respiratory, Psychiatric, Allergic/Imm,  Heme/Lymph   Last edited by Corrinne EagleEnglish, Ashley L on 06/01/2018  8:50 AM. (History)       ALLERGIES No Known Allergies  PAST MEDICAL HISTORY Past Medical History:  Diagnosis Date  . Abdominal pain    Hx: of  . Complication of anesthesia   . Headache(784.0)    Hx: of Migraines  . Heart murmur   . PONV (postoperative nausea and vomiting)   . Shortness of breath    Hx: of   Past Surgical History:  Procedure Laterality Date  . AXILLARY SURGERY  06/26/2012   cyst removed  . EAR CYST EXCISION Left 06/26/2012   Procedure: excison of left axillary CYST REMOVAL;  Surgeon: Lodema PilotBrian Layton, DO;  Location: [facility] OR;  Service: General;  Laterality: Left;  . NO PAST SURGERIES    . REFRACTIVE SURGERY      FAMILY HISTORY Family History  Problem Relation Age of Onset  . HIV Father   . Hypertension Mother     SOCIAL HISTORY Social History   Tobacco Use  . Smoking status: Never Smoker  . Smokeless tobacco: Never Used  Substance Use Topics  . Alcohol use: Yes    Comment: occasional  . Drug use: Yes    Types: Marijuana    Comment: Daily         OPHTHALMIC EXAM:  Base Eye Exam    Visual Acuity (Snellen - Linear)      Right Left  Dist [place] 20/20 -1 20/800   Dist ph [place]  20/200       Tonometry (Tonopen, 8:54 AM)      Right Left   Pressure 13 13       Pupils      Dark Light Shape React APD   Right 3 2 Round Minimal 0   Left 3 2 Round Minimal 0       Extraocular Movement      Right Left    Full Full       Neuro/Psych    Oriented x3:  Yes   Mood/Affect:  Normal       Dilation    Both eyes:  1.0% Mydriacyl, 2.5% Phenylephrine @ 8:54 AM        Slit Lamp and Fundus Exam    Slit Lamp Exam      Right Left   Lids/Lashes +MGD, palprebral conj papillary reaction Normal   Conjunctiva/Sclera melanosis, mild Injection mild Melanosis   Cornea Clear, no fluorescein staining Clear   Anterior Chamber Deep, 2-3+ cell/pigment Deep   Iris Round and reactive Round and dilated    Lens Clear Clear   Vitreous Normal Vitreous syneresis       Fundus Exam      Right Left   Disc Pink and Sharp tilted; temporal PPA   C/D Ratio 0.3 0.4   Macula flat; good foveal reflex flat; blunted foveal reflex; RPE mottling and clumping   Vessels Normal Normal   Periphery attached inf temporal SRF/RD from 0400-0600 with thin atrophic retina - good laser surrounding; otherwise attached          IMAGING AND PROCEDURES  Imaging and Procedures for 10/29/17  OCT, Retina - OU - Both Eyes       Right Eye Quality was good. Central Foveal Thickness: 205. Progression has been stable. Findings include normal foveal contour, no IRF, no SRF.   Left Eye Quality was good. Central Foveal Thickness: 194. Progression has been stable. Findings include myopic contour, outer retinal atrophy, normal foveal contour, no IRF, no SRF.   Notes *Images captured and stored on drive  Diagnosis / Impression:  OD: NFP, No IRF/SRF OS: severely myopic contour; atrophic retina; no IRF/SRF centrally  Clinical management:  See below  Abbreviations: NFP - Normal foveal profile. CME - cystoid macular edema. PED - pigment epithelial detachment. IRF - intraretinal fluid. SRF - subretinal fluid. EZ - ellipsoid zone. ERM - epiretinal membrane. ORA - outer retinal atrophy. ORT - outer retinal tubulation. SRHM - subretinal hyper-reflective material                  ASSESSMENT/PLAN:    ICD-10-CM   1. Iritis of right eye H20.9   2. Blepharoconjunctivitis of right eye, unspecified blepharoconjunctivitis type H10.501   3. Left retinal detachment H33.22   4. Amblyopia, left eye H53.002   5. Retinal edema H35.81 OCT, Retina - OU - Both Eyes    1,2. Iritis and blepharoconjunctivitis OD - onset: yesterday, 4.23.20 upon waking - denies sick contacts or contact with pink eye - history of iritis w/ blepharoconjunctivitis OS last year - exam shows 2-3+ AC cell/pigment; mild injection; +papillary rxn  tarsal conj; +epiphora and photophobia - recommend Lotemax QID OD + atropine BID OD -- sample bottles given to patient  3. Chronic inferior retinal detachment, OS  - inf temporal SRF/RD - s/p laser retinopexy OS (09.27.19) -- good barricade laser surrounding RD to ora - Optos photos obtained (11.12.19)  for comparison - stable today on exam  4.  Amblyopia OS - pt reports history of patching as a child - monitor  5. No retinal edema on exam or OCT   Ophthalmic Meds Ordered this visit:  No orders of the defined types were placed in this encounter.      Return for f/u Weds, April 29 , [place], [place].  There are no Patient Instructions on file for this visit.   Explained the diagnoses, plan, and follow up with the patient and they expressed understanding.  Patient expressed understanding of the importance of proper follow up care.   This document serves as a record of services personally performed by [name], MD, PhD. It was created on their behalf by Laurian Brim, OA, an ophthalmic assistant. The creation of this record is the provider's dictation and/or activities during the visit.    Electronically signed by: Laurian Brim, OA 04.24.2020 9:49 AM     [name], M.D., Ph.D. Diseases & Surgery of the Retina and Vitreous [facility]  I have reviewed the above documentation for accuracy and completeness, and I agree with the above. [name], M.D., Ph.D. 06/01/18 9:49 AM   Abbreviations: M myopia (nearsighted); A astigmatism; H hyperopia (farsighted); P presbyopia; Mrx spectacle prescription;  CTL contact lenses; OD right eye; OS left eye; OU both eyes  XT exotropia; ET esotropia; PEK punctate epithelial keratitis; PEE punctate epithelial erosions; DES dry eye syndrome; MGD meibomian gland dysfunction; ATs artificial tears; PFAT's preservative free artificial tears; NSC nuclear sclerotic cataract; PSC posterior subcapsular cataract; ERM  epi-retinal membrane; PVD posterior vitreous detachment; RD retinal detachment; DM diabetes mellitus; DR diabetic retinopathy; NPDR non-proliferative diabetic retinopathy; PDR proliferative diabetic retinopathy; CSME clinically significant macular edema; DME diabetic macular edema; dbh dot blot hemorrhages; CWS cotton wool spot; POAG primary open angle glaucoma; C/D cup-to-disc ratio; HVF humphrey visual field; GVF goldmann visual field; OCT optical coherence tomography; IOP intraocular pressure; BRVO Branch retinal vein occlusion; CRVO central retinal vein occlusion; CRAO central retinal artery occlusion; BRAO branch retinal artery occlusion; RT retinal tear; SB scleral buckle; PPV pars plana vitrectomy; VH Vitreous hemorrhage; PRP panretinal laser photocoagulation; IVK intravitreal kenalog; VMT vitreomacular traction; MH Macular hole;  NVD neovascularization of the disc; NVE neovascularization elsewhere; AREDS age related eye disease study; ARMD age related macular degeneration; POAG primary open angle glaucoma; EBMD epithelial/anterior basement membrane dystrophy; ACIOL anterior chamber intraocular lens; IOL intraocular lens; PCIOL posterior chamber intraocular lens; Phaco/IOL phacoemulsification with intraocular lens placement; PRK photorefractive keratectomy; LASIK laser assisted in situ keratomileusis; HTN hypertension; DM diabetes mellitus; COPD chronic obstructive pulmonary disease

## 2018-06-05 NOTE — Progress Notes (Addendum)
[facility] - Clinic Note  06/06/2018     CHIEF COMPLAINT Patient presents for Retina Follow Up   HISTORY OF PRESENT ILLNESS: Jaime Shelton is a 28 y.o. female who presents to the clinic today for:   HPI    Retina Follow Up    Patient presents with  Other.  In right eye.  This started 4 weeks ago.  Since onset it is stable.  I, the attending physician,  performed the HPI with the patient and updated documentation appropriately.          Comments    F/U Iriris OD. Patient states her vision is"blurry and she is have eye pain" ou rates 8, she is also having light sensitivity ou. Pt is using Lotemax QID OD, Atropine Bid OD.       Last edited by Rennis Chris, MD on 06/06/2018 10:01 AM. (History)    pt states she does not feel like her eyes have gotten any better, she states they are still red, sensitive to light, and painful, pt states she has been using the drops as directed   Referring physician: [facility] [address] [place], [place] [address]  HISTORICAL INFORMATION:   Selected notes from the MEDICAL RECORD NUMBER    CURRENT MEDICATIONS: Current Outpatient Medications (Ophthalmic Drugs)  Medication Sig  . prednisoLONE acetate (PRED FORTE) 1 % ophthalmic suspension Place 1 drop into the right eye every hour.   No current facility-administered medications for this visit.  (Ophthalmic Drugs)   Current Outpatient Medications (Other)  Medication Sig  . Aspirin-Acetaminophen-Caffeine (GOODY HEADACHE PO) Take 1 Package by mouth daily as needed (headache).  . traMADol (ULTRAM) 50 MG tablet Take 1 tablet (50 mg total) by mouth every 6 (six) hours as needed for severe pain.   No current facility-administered medications for this visit.  (Other)      REVIEW OF SYSTEMS: ROS    Positive for: Eyes   Negative for: Constitutional, Gastrointestinal, Neurological, Skin, Genitourinary, Musculoskeletal, HENT, Endocrine, Cardiovascular, Respiratory,  Psychiatric, Allergic/Imm, Heme/Lymph   Last edited by Eldridge Scot, LPN on 07/12/5407  9:57 AM. (History)       ALLERGIES No Known Allergies  PAST MEDICAL HISTORY Past Medical History:  Diagnosis Date  . Abdominal pain    Hx: of  . Complication of anesthesia   . Headache(784.0)    Hx: of Migraines  . Heart murmur   . PONV (postoperative nausea and vomiting)   . Shortness of breath    Hx: of   Past Surgical History:  Procedure Laterality Date  . AXILLARY SURGERY  06/26/2012   cyst removed  . EAR CYST EXCISION Left 06/26/2012   Procedure: excison of left axillary CYST REMOVAL;  Surgeon: Lodema Pilot, DO;  Location: [facility] OR;  Service: General;  Laterality: Left;  . NO PAST SURGERIES    . REFRACTIVE SURGERY      FAMILY HISTORY Family History  Problem Relation Age of Onset  . HIV Father   . Hypertension Mother     SOCIAL HISTORY Social History   Tobacco Use  . Smoking status: Never Smoker  . Smokeless tobacco: Never Used  Substance Use Topics  . Alcohol use: Yes    Comment: occasional  . Drug use: Yes    Types: Marijuana    Comment: Daily         OPHTHALMIC EXAM:  Base Eye Exam    Visual Acuity (Snellen - Linear)      Right  Left   Dist [place] 20/20 -2 CF at 3'   Dist ph [place] 20/20    Correction:  Glasses       Tonometry (Tonopen, 9:54 AM)      Right Left   Pressure 16 15       Pupils      Dark Light Shape React APD   Right 4 3 Round Brisk None   Left 4 3 Round Slow None       Visual Fields      Left Right   Restrictions Partial outer superior temporal, inferior temporal, inferior nasal deficiencies        Extraocular Movement      Right Left    Full, Ortho Full, Ortho       Neuro/Psych    Oriented x3:  Yes   Mood/Affect:  Normal       Dilation    Both eyes:  1.0% Mydriacyl, 2.5% Phenylephrine @ 9:54 AM        Slit Lamp and Fundus Exam    Slit Lamp Exam      Right Left   Lids/Lashes +MGD, palprebral conj papillary reaction  Normal   Conjunctiva/Sclera melanosis, mild Injection mild Melanosis   Cornea Clear, no fluorescein staining, endo pigment inferiorly Clear   Anterior Chamber Deep, 3+ cell/pigment Deep   Iris Round and reactive Round and dilated   Lens Clear Clear   Vitreous Normal Vitreous syneresis       Fundus Exam      Right Left   Disc Pink and Sharp tilted; temporal PPA   C/D Ratio 0.3 0.4   Macula flat; good foveal reflex flat; blunted foveal reflex; RPE mottling and clumping   Vessels Normal Normal   Periphery attached inf temporal SRF/RD from 0400-0600 with thin atrophic retina - good laser surrounding; otherwise attached          IMAGING AND PROCEDURES  Imaging and Procedures for 10/29/17  OCT, Retina - OU - Both Eyes       Right Eye Quality was good. Central Foveal Thickness: 204. Progression has been stable. Findings include normal foveal contour, no IRF, no SRF.   Left Eye Quality was good. Central Foveal Thickness: 188. Progression has been stable. Findings include myopic contour, outer retinal atrophy, normal foveal contour, no IRF, no SRF.   Notes *Images captured and stored on drive  Diagnosis / Impression:  OD: NFP, No IRF/SRF OS: severely myopic contour; atrophic retina; no IRF/SRF centrally  Clinical management:  See below  Abbreviations: NFP - Normal foveal profile. CME - cystoid macular edema. PED - pigment epithelial detachment. IRF - intraretinal fluid. SRF - subretinal fluid. EZ - ellipsoid zone. ERM - epiretinal membrane. ORA - outer retinal atrophy. ORT - outer retinal tubulation. SRHM - subretinal hyper-reflective material                  ASSESSMENT/PLAN:    ICD-10-CM   1. Iritis of right eye H20.9   2. Blepharoconjunctivitis of right eye, unspecified blepharoconjunctivitis type H10.501   3. Left retinal detachment H33.22   4. Amblyopia, left eye H53.002   5. Retinal edema H35.81 OCT, Retina - OU - Both Eyes  6. Iritis of left eye H20.9    7. 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 Iritis and blepharoconjunctivitis OD - onset: 4.23.20 upon waking - denies sick contacts or contact with pink eye - history of  iritis w/ blepharoconjunctivitis OS last year - exam shows 3+ AC cell/pigment; mild injection; +papillary rxn tarsal conj; +epiphora and photophobia  - no subjective or objective improvement on lotemax QID + atropine BID OD - hold Lotemax QID OD  - start PF Q1H - cont atropine BID OD - f/u Monday, May 4  3. Chronic inferior retinal detachment, OS  - inf temporal SRF/RD - s/p laser retinopexy OS (09.27.19) -- good barricade laser surrounding RD to ora - Optos photos obtained (11.12.19) for comparison - stable today on exam  4.  Amblyopia OS - pt reports history of patching as a child - monitor  5. No retinal edema on exam or OCT   Ophthalmic Meds Ordered this visit:  Meds ordered this encounter  Medications  . prednisoLONE acetate (PRED FORTE) 1 % ophthalmic suspension    Sig: Place 1 drop into the right eye every hour.    Dispense:  15 mL    Refill:  0       Return for f/u Monday, May 4 iritis.  There are no Patient Instructions on file for this visit.   Explained the diagnoses, plan, and follow up with the patient and they expressed understanding.  Patient expressed understanding of the importance of proper follow up care.   This document serves as a record of services personally performed by Karie ChimeraBrian G. Baileigh Modisette, MD, PhD. It was created on their behalf by Laurian BrimAmanda Brown, OA, an ophthalmic assistant. The creation of this record is the provider's dictation and/or activities during the visit.    Electronically signed by: Laurian BrimAmanda Brown, OA  04.28.2020 10:10 AM    Karie ChimeraBrian G. Jamirra Curnow, M.D., Ph.D. Diseases & Surgery of the Retina and Vitreous [facility]  I have reviewed the above documentation for  accuracy and completeness, and I agree with the above. Karie ChimeraBrian G. Biran Mayberry, M.D., Ph.D. 06/06/18 10:16 AM    Abbreviations: M myopia (nearsighted); A astigmatism; H hyperopia (farsighted); P presbyopia; Mrx spectacle prescription;  CTL contact lenses; OD right eye; OS left eye; OU both eyes  XT exotropia; ET esotropia; PEK punctate epithelial keratitis; PEE punctate epithelial erosions; DES dry eye syndrome; MGD meibomian gland dysfunction; ATs artificial tears; PFAT's preservative free artificial tears; NSC nuclear sclerotic cataract; PSC posterior subcapsular cataract; ERM epi-retinal membrane; PVD posterior vitreous detachment; RD retinal detachment; DM diabetes mellitus; DR diabetic retinopathy; NPDR non-proliferative diabetic retinopathy; PDR proliferative diabetic retinopathy; CSME clinically significant macular edema; DME diabetic macular edema; dbh dot blot hemorrhages; CWS cotton wool spot; POAG primary open angle glaucoma; C/D cup-to-disc ratio; HVF humphrey visual field; GVF goldmann visual field; OCT optical coherence tomography; IOP intraocular pressure; BRVO Branch retinal vein occlusion; CRVO central retinal vein occlusion; CRAO central retinal artery occlusion; BRAO branch retinal artery occlusion; RT retinal tear; SB scleral buckle; PPV pars plana vitrectomy; VH Vitreous hemorrhage; PRP panretinal laser photocoagulation; IVK intravitreal kenalog; VMT vitreomacular traction; MH Macular hole;  NVD neovascularization of the disc; NVE neovascularization elsewhere; AREDS age related eye disease study; ARMD age related macular degeneration; POAG primary open angle glaucoma; EBMD epithelial/anterior basement membrane dystrophy; ACIOL anterior chamber intraocular lens; IOL intraocular lens; PCIOL posterior chamber intraocular lens; Phaco/IOL phacoemulsification with intraocular lens placement; PRK photorefractive keratectomy; LASIK laser assisted in situ keratomileusis; HTN hypertension; DM diabetes  mellitus; COPD chronic obstructive pulmonary disease

## 2018-06-06 ENCOUNTER — Ambulatory Visit (INDEPENDENT_AMBULATORY_CARE_PROVIDER_SITE_OTHER): Payer: Medicaid Other | Admitting: Ophthalmology

## 2018-06-06 ENCOUNTER — Other Ambulatory Visit: Payer: Self-pay

## 2018-06-06 ENCOUNTER — Encounter (INDEPENDENT_AMBULATORY_CARE_PROVIDER_SITE_OTHER): Payer: Self-pay | Admitting: Ophthalmology

## 2018-06-06 DIAGNOSIS — H10501 Unspecified blepharoconjunctivitis, right eye: Secondary | ICD-10-CM | POA: Diagnosis not present

## 2018-06-06 DIAGNOSIS — H3581 Retinal edema: Secondary | ICD-10-CM | POA: Diagnosis not present

## 2018-06-06 DIAGNOSIS — H53002 Unspecified amblyopia, left eye: Secondary | ICD-10-CM | POA: Diagnosis not present

## 2018-06-06 DIAGNOSIS — H209 Unspecified iridocyclitis: Secondary | ICD-10-CM | POA: Diagnosis not present

## 2018-06-06 DIAGNOSIS — H3322 Serous retinal detachment, left eye: Secondary | ICD-10-CM | POA: Diagnosis not present

## 2018-06-06 MED ORDER — PREDNISOLONE ACETATE 1 % OP SUSP: 1.0000 [drp] | OPHTHALMIC | 0 refills | Status: DC

## 2018-06-10 NOTE — Progress Notes (Signed)
[facility] - Clinic Note  06/11/2018     CHIEF COMPLAINT Patient presents for Retina Follow Up   HISTORY OF PRESENT ILLNESS: Jaime Shelton is a 28 y.o. female who presents to the clinic today for:   HPI    Retina Follow Up    Patient presents with  Other.  In right eye.  This started 4 days ago.  Severity is moderate.  Duration of 4 weeks.  Since onset it is gradually improving.  I, the attending physician,  performed the HPI with the patient and updated documentation appropriately.          Comments    Patient here for 4 days retina follow up for iritis OD. Patient states vision doing ok, blurry. Eye is hurting right now. Eye didn't hurt the whole weekend. Lights are bothersome. Eye achy.       Last edited by Rennis Chris, MD on 06/11/2018  8:52 AM. (History)    pt states her eye was okay all weekend, but is aching this morning when light hits it, she states she was not able to the drop that was prescribed last week bc the pharmacy states they do not have her medicaid info on file and medicaid will not give out info over the phone, pt states she has not been using any drops over the weekend   Referring physician: [facility] [address] [place], [place] [address]  HISTORICAL INFORMATION:   Selected notes from the MEDICAL RECORD NUMBER    CURRENT MEDICATIONS: Current Outpatient Medications (Ophthalmic Drugs)  Medication Sig  . prednisoLONE acetate (PRED FORTE) 1 % ophthalmic suspension Place 1 drop into the right eye every hour.   No current facility-administered medications for this visit.  (Ophthalmic Drugs)   Current Outpatient Medications (Other)  Medication Sig  . Aspirin-Acetaminophen-Caffeine (GOODY HEADACHE PO) Take 1 Package by mouth daily as needed (headache).  . traMADol (ULTRAM) 50 MG tablet Take 1 tablet (50 mg total) by mouth every 6 (six) hours as needed for severe pain.   No current facility-administered medications for this  visit.  (Other)      REVIEW OF SYSTEMS: ROS    Positive for: Eyes   Negative for: Constitutional, Gastrointestinal, Neurological, Skin, Genitourinary, Musculoskeletal, HENT, Endocrine, Cardiovascular, Respiratory, Psychiatric, Allergic/Imm, Heme/Lymph   Last edited by Laddie Aquas on 06/11/2018  8:32 AM. (History)       ALLERGIES No Known Allergies  PAST MEDICAL HISTORY Past Medical History:  Diagnosis Date  . Abdominal pain    Hx: of  . Complication of anesthesia   . Headache(784.0)    Hx: of Migraines  . Heart murmur   . PONV (postoperative nausea and vomiting)   . Shortness of breath    Hx: of   Past Surgical History:  Procedure Laterality Date  . AXILLARY SURGERY  06/26/2012   cyst removed  . EAR CYST EXCISION Left 06/26/2012   Procedure: excison of left axillary CYST REMOVAL;  Surgeon: Lodema Pilot, DO;  Location: [facility] OR;  Service: General;  Laterality: Left;  . NO PAST SURGERIES    . REFRACTIVE SURGERY      FAMILY HISTORY Family History  Problem Relation Age of Onset  . HIV Father   . Hypertension Mother     SOCIAL HISTORY Social History   Tobacco Use  . Smoking status: Never Smoker  . Smokeless tobacco: Never Used  Substance Use Topics  . Alcohol use: Yes    Comment: occasional  .  Drug use: Yes    Types: Marijuana    Comment: Daily         OPHTHALMIC EXAM:  Base Eye Exam    Visual Acuity (Snellen - Linear)      Right Left   Dist [place] 20/20 20/800   Dist ph [place]  20/150 -1       Tonometry (Tonopen, 8:28 AM)      Right Left   Pressure 17 20       Pupils      Dark Light Shape React APD   Right 4 3 Round Brisk None   Left 4 3 Round Slow None       Visual Fields (Counting fingers)      Left Right     Full   Restrictions Partial outer superior temporal, inferior temporal, inferior nasal deficiencies        Extraocular Movement      Right Left    Full, Ortho Full, Ortho       Neuro/Psych    Oriented x3:  Yes   Mood/Affect:   Normal       Dilation    Both eyes:  1.0% Mydriacyl, 2.5% Phenylephrine @ 8:28 AM        Slit Lamp and Fundus Exam    Slit Lamp Exam      Right Left   Lids/Lashes +MGD, palprebral conj papillary reaction Normal   Conjunctiva/Sclera melanosis, mild Injection mild Melanosis   Cornea Clear, endo pigment inferiorly Clear   Anterior Chamber Deep, 1-2+ cell/pigment Deep   Iris Round and reactive Round and dilated   Lens Clear Clear   Vitreous Normal Vitreous syneresis       Fundus Exam      Right Left   Disc Pink and Sharp tilted; temporal PPA   C/D Ratio 0.3 0.4   Macula flat; good foveal reflex flat; blunted foveal reflex; RPE mottling and clumping   Vessels Normal Normal   Periphery attached inf temporal SRF/RD from 0400-0600 with thin atrophic retina - good laser surrounding; otherwise attached          IMAGING AND PROCEDURES  Imaging and Procedures for 10/29/17  OCT, Retina - OU - Both Eyes       Right Eye Quality was good. Central Foveal Thickness: 203. Progression has been stable. Findings include normal foveal contour, no IRF, no SRF.   Left Eye Quality was good. Central Foveal Thickness: 187. Progression has been stable. Findings include myopic contour, outer retinal atrophy, normal foveal contour, no IRF, no SRF (Stable inferotemporal detachment caught on widefield -- good reference).   Notes *Images captured and stored on drive  Diagnosis / Impression:  OD: NFP, No IRF/SRF OS: severely myopic contour; atrophic retina; no IRF/SRF centrally  Clinical management:  See below  Abbreviations: NFP - Normal foveal profile. CME - cystoid macular edema. PED - pigment epithelial detachment. IRF - intraretinal fluid. SRF - subretinal fluid. EZ - ellipsoid zone. ERM - epiretinal membrane. ORA - outer retinal atrophy. ORT - outer retinal tubulation. SRHM - subretinal hyper-reflective material                  ASSESSMENT/PLAN:    ICD-10-CM   1. Iritis of  right eye H20.9   2. Blepharoconjunctivitis of right eye, unspecified blepharoconjunctivitis type H10.501   3. Left retinal detachment H33.22   4. Retinal edema H35.81 OCT, Retina - OU - Both Eyes    1,2. 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 Chronic inferior retinal detachment, OS  - inf temporal SRF/RD - s/p laser retinopexy OS (09.27.19) -- good barricade laser surrounding RD to ora - Optos photos obtained (11.12.19) for comparison - stable today on exam  4.  Amblyopia OS - pt reports history of patching as a child - monitor  5. No retinal edema on exam or OCT   Ophthalmic Meds Ordered this visit:  No orders of the defined types were placed in this encounter.      Return in about 2 weeks (around 06/25/2018) for iritis OD.  There are no Patient Instructions on file for this visit.   Explained the diagnoses, plan, and follow up with the patient and they expressed understanding.  Patient expressed understanding of the importance of proper follow up care.   This document serves as a record of services personally performed by [name], MD, PhD. It was created on their behalf by Laurian Brim, OA, an ophthalmic assistant. The creation of this record is the provider's dictation and/or activities during the visit.    Electronically signed by: Laurian Brim, OA  05.03.2020 12:23 PM     [name], M.D., Ph.D. Diseases & Surgery of the Retina and Vitreous [facility]  I have reviewed the above documentation for accuracy and completeness, and I agree with the above. [name], M.D., Ph.D. 06/11/18 12:23 PM     Abbreviations: M myopia (nearsighted); A astigmatism; H hyperopia (farsighted); P presbyopia; Mrx spectacle prescription;  CTL contact lenses; OD right eye; OS left eye; OU both eyes  XT exotropia; ET esotropia; PEK punctate epithelial keratitis; PEE punctate epithelial erosions; DES dry eye syndrome; MGD meibomian gland dysfunction; ATs artificial tears; PFAT's preservative free artificial tears; NSC nuclear sclerotic cataract; PSC posterior subcapsular cataract; ERM epi-retinal membrane; PVD posterior vitreous detachment; RD retinal detachment; DM diabetes mellitus; DR diabetic retinopathy; NPDR non-proliferative diabetic retinopathy; PDR proliferative diabetic retinopathy; CSME clinically significant macular edema; DME diabetic macular edema; dbh dot blot hemorrhages; CWS cotton wool spot; POAG primary open angle glaucoma; C/D cup-to-disc ratio; HVF humphrey visual field; GVF goldmann visual field; OCT optical coherence tomography; IOP intraocular pressure; BRVO Branch retinal vein occlusion; CRVO central retinal vein occlusion; CRAO central retinal artery occlusion; BRAO branch retinal artery occlusion; RT retinal tear; SB scleral buckle; PPV pars plana vitrectomy; VH Vitreous hemorrhage; PRP panretinal laser photocoagulation; IVK intravitreal kenalog; VMT vitreomacular traction; MH Macular hole;  NVD neovascularization of the disc; NVE neovascularization elsewhere; AREDS age related eye disease study; ARMD age related macular degeneration; POAG primary open angle glaucoma; EBMD epithelial/anterior basement membrane dystrophy; ACIOL anterior chamber intraocular lens; IOL intraocular lens; PCIOL posterior chamber intraocular lens; Phaco/IOL  phacoemulsification with intraocular lens placement; PRK photorefractive keratectomy; LASIK laser assisted in situ keratomileusis; HTN hypertension; DM diabetes mellitus; COPD chronic obstructive pulmonary disease

## 2018-06-11 ENCOUNTER — Other Ambulatory Visit: Payer: Self-pay

## 2018-06-11 ENCOUNTER — Encounter (INDEPENDENT_AMBULATORY_CARE_PROVIDER_SITE_OTHER): Payer: Self-pay | Admitting: Ophthalmology

## 2018-06-11 ENCOUNTER — Ambulatory Visit (INDEPENDENT_AMBULATORY_CARE_PROVIDER_SITE_OTHER): Payer: Medicaid Other | Admitting: Ophthalmology

## 2018-06-11 DIAGNOSIS — H3581 Retinal edema: Secondary | ICD-10-CM | POA: Diagnosis not present

## 2018-06-11 DIAGNOSIS — H10501 Unspecified blepharoconjunctivitis, right eye: Secondary | ICD-10-CM | POA: Diagnosis not present

## 2018-06-11 DIAGNOSIS — H209 Unspecified iridocyclitis: Secondary | ICD-10-CM | POA: Diagnosis not present

## 2018-06-11 DIAGNOSIS — H3322 Serous retinal detachment, left eye: Secondary | ICD-10-CM | POA: Diagnosis not present

## 2018-06-24 NOTE — Progress Notes (Addendum)
[facility] - Clinic Note  06/25/2018     CHIEF COMPLAINT Patient presents for Retina Follow Up   HISTORY OF PRESENT ILLNESS: Jaime Shelton is a 28 y.o. female who presents to the clinic today for:   HPI    Retina Follow Up    Patient presents with  Other.  In right eye.  This started 4 weeks ago.  Severity is mild.  Duration of 2 weeks.  Since onset it is stable.  I, the attending physician,  performed the HPI with the patient and updated documentation appropriately.          Comments    28 y/o female pt here for 2 wk f/u for iritis OD.  Pt reports VA much blurrier OU.  Blur noticed more in the evenings.  Also reports frequent quick, sharp pains OU in the evenings.  C/o glare and halos OU; worse in the evenings.  Denies flashes, floaters.  No gtts.       Last edited by Rennis ChrisZamora, Kelwin Gibler, MD on 06/25/2018 10:15 AM. (History)    pt states she ran out of lotemax over the weekend   Referring physician: [facility] [address] [place], [place] [id number]  HISTORICAL INFORMATION:   Selected notes from the MEDICAL RECORD NUMBER    CURRENT MEDICATIONS: Current Outpatient Medications (Ophthalmic Drugs)  Medication Sig  . prednisoLONE acetate (PRED FORTE) 1 % ophthalmic suspension Place 1 drop into the right eye every hour. (Patient not taking: Reported on 06/25/2018)   No current facility-administered medications for this visit.  (Ophthalmic Drugs)   Current Outpatient Medications (Other)  Medication Sig  . Aspirin-Acetaminophen-Caffeine (GOODY HEADACHE PO) Take 1 Package by mouth daily as needed (headache).  . traMADol (ULTRAM) 50 MG tablet Take 1 tablet (50 mg total) by mouth every 6 (six) hours as needed for severe pain.   No current facility-administered medications for this visit.  (Other)      REVIEW OF SYSTEMS: ROS    Positive for: Eyes   Negative for: Constitutional, Gastrointestinal, Neurological, Skin, Genitourinary, Musculoskeletal,  HENT, Endocrine, Cardiovascular, Respiratory, Psychiatric, Allergic/Imm, Heme/Lymph   Last edited by Celine MansBaxley, Andrew G, COA on 06/25/2018  9:38 AM. (History)       ALLERGIES No Known Allergies  PAST MEDICAL HISTORY Past Medical History:  Diagnosis Date  . Abdominal pain    Hx: of  . Complication of anesthesia   . Headache(784.0)    Hx: of Migraines  . Heart murmur   . PONV (postoperative nausea and vomiting)   . Shortness of breath    Hx: of   Past Surgical History:  Procedure Laterality Date  . AXILLARY SURGERY  06/26/2012   cyst removed  . EAR CYST EXCISION Left 06/26/2012   Procedure: excison of left axillary CYST REMOVAL;  Surgeon: Lodema PilotBrian Layton, DO;  Location: [facility] OR;  Service: General;  Laterality: Left;  . NO PAST SURGERIES    . REFRACTIVE SURGERY      FAMILY HISTORY Family History  Problem Relation Age of Onset  . HIV Father   . Hypertension Mother     SOCIAL HISTORY Social History   Tobacco Use  . Smoking status: Never Smoker  . Smokeless tobacco: Never Used  Substance Use Topics  . Alcohol use: Yes    Comment: occasional  . Drug use: Yes    Types: Marijuana    Comment: Daily         OPHTHALMIC EXAM:  Base Eye Exam  Visual Acuity (Snellen - Linear)      Right Left   Dist [place] 20/20 +2 CF   Dist ph [place]  20/150 -2       Tonometry (Tonopen, 9:43 AM)      Right Left   Pressure 15 17       Pupils      Dark Light Shape React APD   Right 4 3 Round Brisk None   Left 4 3 Round Brisk None       Visual Fields (Counting fingers)      Left Right    Full Full       Extraocular Movement      Right Left    Full, Ortho Full, Ortho       Neuro/Psych    Oriented x3:  Yes   Mood/Affect:  Normal       Dilation    Both eyes:  1.0% Mydriacyl, 2.5% Phenylephrine @ 9:43 AM        Slit Lamp and Fundus Exam    Slit Lamp Exam      Right Left   Lids/Lashes +MGD, palprebral conj papillary reaction Normal   Conjunctiva/Sclera melanosis, mild  Injection mild Melanosis   Cornea Clear, endo pigment inferiorly Clear   Anterior Chamber Deep, no cell/pigment Deep   Iris Round and reactive Round and dilated   Lens Clear Clear   Vitreous Normal Vitreous syneresis       Fundus Exam      Right Left   Disc Pink and Sharp tilted; temporal PPA   C/D Ratio 0.3 0.4   Macula flat; good foveal reflex flat; blunted foveal reflex; RPE mottling and clumping   Vessels Normal Normal   Periphery attached inf temporal SRF/RD from 0400-0600 with thin atrophic retina - good laser surrounding; otherwise attached          IMAGING AND PROCEDURES  Imaging and Procedures for 10/29/17  OCT, Retina - OU - Both Eyes       Right Eye Quality was good. Central Foveal Thickness: 204. Progression has been stable. Findings include normal foveal contour, no IRF, no SRF.   Left Eye Quality was good. Central Foveal Thickness: 189. Progression has been stable. Findings include myopic contour, outer retinal atrophy, normal foveal contour, no IRF, no SRF.   Notes *Images captured and stored on drive  Diagnosis / Impression:  OD: NFP, No IRF/SRF OS: severely myopic contour; atrophic retina; no IRF/SRF centrally  Clinical management:  See below  Abbreviations: NFP - Normal foveal profile. CME - cystoid macular edema. PED - pigment epithelial detachment. IRF - intraretinal fluid. SRF - subretinal fluid. EZ - ellipsoid zone. ERM - epiretinal membrane. ORA - outer retinal atrophy. ORT - outer retinal tubulation. SRHM - subretinal hyper-reflective material                  ASSESSMENT/PLAN:    ICD-10-CM   1. Iritis of right eye H20.9   2. Blepharoconjunctivitis of right eye, unspecified blepharoconjunctivitis type H10.501   3. Left retinal detachment H33.22   4. Amblyopia, left eye H53.002   5. Retinal edema H35.81 OCT, Retina - OU - Both Eyes    1,2. 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 Chronic inferior retinal detachment, OS   - inf temporal SRF/RD  - s/p laser retinopexy OS (09.27.19) -- good barricade laser surrounding RD to ora  - Optos photos obtained (11.12.19) for comparison  - stable today on exam  4.  Amblyopia OS  - pt reports history of patching as a child  - monitor  5. No retinal edema on exam or OCT   Ophthalmic Meds Ordered this visit:  No orders of the defined types were placed in this encounter.      Return if symptoms worsen or fail to improve.  There are no Patient Instructions on file for this visit.   Explained the diagnoses, plan, and follow up with the patient and they expressed understanding.  Patient expressed understanding of the importance of proper follow up care.   This document serves as a record of services personally performed by [name], MD, PhD. It was created on their behalf by Laurian Brim, OA, an ophthalmic assistant. The creation of this record is the provider's dictation and/or activities during the visit.    Electronically signed by: Laurian Brim, OA  05.17.2020 12:16 PM     [name], M.D., Ph.D. Diseases & Surgery of the Retina and Vitreous [facility]  I have reviewed the above documentation for accuracy and completeness, and I agree with the above. [name], M.D., Ph.D. 06/25/18 12:16 PM    Abbreviations: M myopia (nearsighted); A astigmatism; H hyperopia (farsighted); P presbyopia; Mrx spectacle prescription;  CTL contact lenses; OD right eye; OS  left eye; OU both eyes  XT exotropia; ET esotropia; PEK punctate epithelial keratitis; PEE punctate epithelial erosions; DES dry eye syndrome; MGD meibomian gland dysfunction; ATs artificial tears; PFAT's preservative free artificial tears; NSC nuclear sclerotic cataract; PSC posterior subcapsular cataract; ERM epi-retinal membrane; PVD posterior vitreous detachment; RD retinal detachment; DM diabetes mellitus; DR diabetic retinopathy; NPDR non-proliferative diabetic retinopathy; PDR proliferative diabetic retinopathy; CSME clinically significant macular edema; DME diabetic macular edema; dbh dot blot hemorrhages; CWS cotton wool spot; POAG primary open angle glaucoma; C/D cup-to-disc ratio; HVF humphrey visual field; GVF goldmann visual field; OCT optical coherence tomography; IOP intraocular pressure; BRVO Branch retinal vein occlusion; CRVO central retinal vein occlusion; CRAO central retinal artery occlusion; BRAO branch retinal artery occlusion; RT retinal tear; SB scleral buckle; PPV pars plana vitrectomy; VH Vitreous hemorrhage; PRP panretinal laser photocoagulation; IVK intravitreal kenalog; VMT vitreomacular traction; MH Macular hole;  NVD neovascularization of the disc; NVE neovascularization elsewhere; AREDS age related eye disease study; ARMD age related macular degeneration; POAG primary open angle glaucoma; EBMD epithelial/anterior basement membrane dystrophy; ACIOL anterior chamber intraocular lens; IOL intraocular lens; PCIOL posterior chamber intraocular lens; Phaco/IOL phacoemulsification with intraocular lens placement; PRK photorefractive keratectomy; LASIK laser assisted in situ keratomileusis; HTN hypertension; DM diabetes mellitus; COPD chronic obstructive pulmonary disease

## 2018-06-25 ENCOUNTER — Other Ambulatory Visit: Payer: Self-pay

## 2018-06-25 ENCOUNTER — Ambulatory Visit (INDEPENDENT_AMBULATORY_CARE_PROVIDER_SITE_OTHER): Payer: Medicaid Other | Admitting: Ophthalmology

## 2018-06-25 ENCOUNTER — Encounter (INDEPENDENT_AMBULATORY_CARE_PROVIDER_SITE_OTHER): Payer: Self-pay | Admitting: Ophthalmology

## 2018-06-25 DIAGNOSIS — H3322 Serous retinal detachment, left eye: Secondary | ICD-10-CM

## 2018-06-25 DIAGNOSIS — H209 Unspecified iridocyclitis: Secondary | ICD-10-CM | POA: Diagnosis not present

## 2018-06-25 DIAGNOSIS — H53002 Unspecified amblyopia, left eye: Secondary | ICD-10-CM

## 2018-06-25 DIAGNOSIS — H3581 Retinal edema: Secondary | ICD-10-CM | POA: Diagnosis not present

## 2018-06-25 DIAGNOSIS — H10501 Unspecified blepharoconjunctivitis, right eye: Secondary | ICD-10-CM | POA: Diagnosis not present

## 2018-10-20 ENCOUNTER — Ambulatory Visit (HOSPITAL_COMMUNITY)
Admission: EM | Admit: 2018-10-20 | Discharge: 2018-10-20 | Disposition: A | Discharge status: Home / Self Care | Payer: Medicaid Other | Attending: Family Medicine | Admitting: Family Medicine

## 2018-10-20 ENCOUNTER — Encounter (HOSPITAL_COMMUNITY): Payer: Self-pay | Admitting: Emergency Medicine

## 2018-10-20 ENCOUNTER — Other Ambulatory Visit: Payer: Self-pay

## 2018-10-20 DIAGNOSIS — Z20828 Contact with and (suspected) exposure to other viral communicable diseases: Secondary | ICD-10-CM | POA: Insufficient documentation

## 2018-10-20 DIAGNOSIS — R51 Headache: Secondary | ICD-10-CM | POA: Diagnosis not present

## 2018-10-20 DIAGNOSIS — R112 Nausea with vomiting, unspecified: Secondary | ICD-10-CM | POA: Insufficient documentation

## 2018-10-20 DIAGNOSIS — A084 Viral intestinal infection, unspecified: Secondary | ICD-10-CM | POA: Diagnosis not present

## 2018-10-20 DIAGNOSIS — R1084 Generalized abdominal pain: Secondary | ICD-10-CM | POA: Diagnosis not present

## 2018-10-20 DIAGNOSIS — R197 Diarrhea, unspecified: Secondary | ICD-10-CM | POA: Diagnosis not present

## 2018-10-20 MED ORDER — ONDANSETRON 4 MG PO TBDP
4.0000 mg | ORAL_TABLET | Freq: Once | ORAL | Status: AC
  Administered 2018-10-20: 4 mg via ORAL

## 2018-10-20 MED ORDER — MELOXICAM 7.5 MG PO TABS: 7.5000 mg | ORAL_TABLET | Freq: Every day | ORAL | 0 refills | Status: DC

## 2018-10-20 MED ORDER — ONDANSETRON 4 MG PO TBDP
ORAL_TABLET | ORAL | Status: AC
  Filled 2018-10-20: qty 1

## 2018-10-20 MED ORDER — ONDANSETRON 8 MG PO TBDP: 8.0000 mg | ORAL_TABLET | Freq: Three times a day (TID) | ORAL | 0 refills | Status: DC | PRN

## 2018-10-20 MED ORDER — LOPERAMIDE HCL 2 MG PO CAPS: 2.0000 mg | ORAL_CAPSULE | Freq: Every day | ORAL | 0 refills | Status: DC | PRN

## 2018-10-20 NOTE — ED Triage Notes (Signed)
One vomiting episode and 3 diarrhea episodes today

## 2018-10-20 NOTE — Discharge Instructions (Addendum)
Make sure you hydrate very well, mix water with Gatorade, 3 parts water to 1 Gatorade.  Eat light meals such as soups, broths, soft fruits. Avoid greasy, carb loaded meals. Use Zofran for nausea and vomiting up to 3 times a day. Imodium is for diarrhea, use this once daily but only if you are having diarrhea.

## 2018-10-20 NOTE — ED Triage Notes (Signed)
Abdominal pain, nausea and diarrhea.  Patient reports vomiting, but says it is "spit", "cannot get anything else up".  Symptoms started 2 days ago, but have become very bad today

## 2018-10-20 NOTE — ED Provider Notes (Signed)
MRN: [id number] DOB: 03-08-1990  Subjective:   Jaime Shelton is a 28 y.o. female presenting for 2-day history of acute onset worsening nausea with vomiting and diarrhea.  Patient has had multiple episodes of vomiting and diarrhea including 3 today.  Today she has not been able to tolerate food and has just been spitting up instead of having full contents in her vomit.  Denies any recent antibiotic use.  Her employer is requiring her to get COVID-19 testing as she also has a headache.  Has not tried any medications for relief.  Patient states that she has not been sexually active for the past 6 months and has had regular cycles.  No current facility-administered medications for this encounter.   Current Outpatient Medications:  .  Aspirin-Acetaminophen-Caffeine (GOODY HEADACHE PO), Take 1 Package by mouth daily as needed (headache)., Disp: , Rfl:  .  prednisoLONE acetate (PRED FORTE) 1 % ophthalmic suspension, Place 1 drop into the right eye every hour. (Patient not taking: Reported on 06/25/2018), Disp: 15 mL, Rfl: 0 .  traMADol (ULTRAM) 50 MG tablet, Take 1 tablet (50 mg total) by mouth every 6 (six) hours as needed for severe pain., Disp: 12 tablet, Rfl: 0   No Known Allergies  Past Medical History:  Diagnosis Date  . Abdominal pain    Hx: of  . Complication of anesthesia   . Headache(784.0)    Hx: of Migraines  . Heart murmur   . PONV (postoperative nausea and vomiting)   . Shortness of breath    Hx: of     Past Surgical History:  Procedure Laterality Date  . AXILLARY SURGERY  06/26/2012   cyst removed  . EAR CYST EXCISION Left 06/26/2012   Procedure: excison of left axillary CYST REMOVAL;  Surgeon: Lodema PilotBrian Layton, DO;  Location: [facility];  Service: General;  Laterality: Left;  . NO PAST SURGERIES    . REFRACTIVE SURGERY      Review of Systems  Constitutional: Positive for malaise/fatigue. Negative for fever.  HENT: Negative for congestion, ear pain, sinus pain and sore throat.    Eyes: Negative for blurred vision, double vision, discharge and redness.  Respiratory: Negative for cough, hemoptysis, shortness of breath and wheezing.   Cardiovascular: Negative for chest pain.  Gastrointestinal: Positive for abdominal pain, diarrhea, nausea and vomiting. Negative for blood in stool and constipation.  Genitourinary: Negative for dysuria, flank pain, frequency, hematuria and urgency.  Musculoskeletal: Positive for myalgias.  Skin: Negative for rash.  Neurological: Positive for weakness. Negative for dizziness and headaches.  Psychiatric/Behavioral: Negative for depression and substance abuse.    Objective:   Vitals: BP (!) 157/91 (BP Location: Left Arm)   Pulse 80   Temp 98.6 F (37 C) (Oral)   Resp 20   LMP 09/27/2018   SpO2 100%   Physical Exam Constitutional:      General: She is not in acute distress.    Appearance: Normal appearance. She is well-developed and normal weight. She is not ill-appearing, toxic-appearing or diaphoretic.  HENT:     Head: Normocephalic and atraumatic.     Right Ear: External ear normal.     Left Ear: External ear normal.     Nose: Nose normal.     Mouth/Throat:     Mouth: Mucous membranes are moist.     Pharynx: Oropharynx is clear.  Eyes:     General: No scleral icterus.    Extraocular Movements: Extraocular movements intact.     Pupils: Pupils are  equal, round, and reactive to light.  Cardiovascular:     Rate and Rhythm: Normal rate and regular rhythm.     Pulses: Normal pulses.     Heart sounds: Normal heart sounds. No murmur. No friction rub. No gallop.   Pulmonary:     Effort: Pulmonary effort is normal. No respiratory distress.     Breath sounds: Normal breath sounds. No stridor. No wheezing, rhonchi or rales.  Abdominal:     General: Bowel sounds are normal. There is no distension.     Palpations: Abdomen is soft. There is no mass.     Tenderness: There is generalized abdominal tenderness (Generalized  throughout). There is no right CVA tenderness, left CVA tenderness, guarding or rebound.  Skin:    General: Skin is warm and dry.     Coloration: Skin is not pale.     Findings: No rash.  Neurological:     General: No focal deficit present.     Mental Status: She is alert and oriented to person, place, and time.  Psychiatric:        Mood and Affect: Mood normal.        Behavior: Behavior normal.        Thought Content: Thought content normal.        Judgment: Judgment normal.     Assessment and Plan :   1. Viral gastroenteritis   2. Nausea vomiting and diarrhea   3. Generalized abdominal pain     We will manage for gastroenteritis with supportive care.  Recommended patient hydrate well, eat light meals and maintain electrolytes.  Will use Zofran and Imodium for nausea, vomiting and diarrhea.  COVID testing is pending but low suspicion of this and suspect more likely viral gastroenteritis.  Given the patient is required to have this tests from her employer, will notify her of results as soon as possible.  Counseled patient on potential for adverse effects with medications prescribed/recommended today, ER and return-to-clinic precautions discussed, patient verbalized understanding.    Jaime Eagles, PA-C 10/20/18 1455

## 2018-10-20 NOTE — ED Notes (Signed)
Labeled sample in lab

## 2018-10-21 LAB — NOVEL CORONAVIRUS, NAA (HOSP ORDER, SEND-OUT TO REF LAB; TAT 18-24 HRS): SARS-CoV-2, NAA: NOT DETECTED

## 2018-10-22 ENCOUNTER — Encounter (HOSPITAL_COMMUNITY): Payer: Self-pay

## 2018-11-27 ENCOUNTER — Ambulatory Visit (HOSPITAL_COMMUNITY)
Admission: EM | Admit: 2018-11-27 | Discharge: 2018-11-27 | Disposition: A | Discharge status: Home / Self Care | Payer: Medicaid Other

## 2018-11-27 ENCOUNTER — Encounter (HOSPITAL_COMMUNITY): Payer: Self-pay

## 2018-11-27 DIAGNOSIS — R630 Anorexia: Secondary | ICD-10-CM

## 2018-11-27 DIAGNOSIS — R112 Nausea with vomiting, unspecified: Secondary | ICD-10-CM | POA: Diagnosis not present

## 2018-11-27 DIAGNOSIS — K047 Periapical abscess without sinus: Secondary | ICD-10-CM

## 2018-11-27 DIAGNOSIS — F1290 Cannabis use, unspecified, uncomplicated: Secondary | ICD-10-CM

## 2018-11-27 MED ORDER — PROMETHAZINE HCL 12.5 MG RE SUPP: 12.5000 mg | Freq: Four times a day (QID) | RECTAL | 0 refills | Status: DC | PRN

## 2018-11-27 MED ORDER — CLINDAMYCIN HCL 150 MG PO CAPS: 150.0000 mg | ORAL_CAPSULE | Freq: Three times a day (TID) | ORAL | 0 refills | Status: DC

## 2018-11-27 NOTE — ED Provider Notes (Signed)
MRN: [id number] DOB: [birth year]  Subjective:   Jaime Shelton is a 28 y.o. female presenting for acute onset this morning of nausea, vomiting, decreased appetite.  She has also started having intermittent abdominal cramping/pain.  Has not tried medications for relief. Smokes marijuana every day. No COVID 19 contacts that she knows of. Was recently started on amoxicillin for dental issues. Denies history of GI issues.   Takes OCP, Effexor.   No Known Allergies  Past Medical History:  Diagnosis Date  . Abdominal pain    Hx: of  . Complication of anesthesia   . Headache(784.0)    Hx: of Migraines  . Heart murmur   . PONV (postoperative nausea and vomiting)   . Shortness of breath    Hx: of     Past Surgical History:  Procedure Laterality Date  . AXILLARY SURGERY  06/26/2012   cyst removed  . EAR CYST EXCISION Left 06/26/2012   Procedure: excison of left axillary CYST REMOVAL;  Surgeon: Madilyn Hook, DO;  Location: [facility];  Service: General;  Laterality: Left;  . NO PAST SURGERIES    . REFRACTIVE SURGERY      Review of Systems  Constitutional: Positive for malaise/fatigue. Negative for fever.  HENT: Negative for congestion, ear pain, sinus pain and sore throat.   Eyes: Negative for blurred vision, double vision, discharge and redness.  Respiratory: Negative for cough, hemoptysis, shortness of breath and wheezing.   Cardiovascular: Positive for chest pain.  Gastrointestinal: Positive for abdominal pain, nausea and vomiting. Negative for blood in stool, constipation and diarrhea.  Genitourinary: Negative for dysuria, flank pain and hematuria.  Musculoskeletal: Positive for myalgias.  Skin: Negative for rash.  Neurological: Negative for dizziness, weakness and headaches.  Psychiatric/Behavioral: Negative for depression and substance abuse.    Objective:   Vitals: BP 128/81   Pulse 70   Temp 98.1 F (36.7 C)   Resp 18   SpO2 100%   Physical Exam Constitutional:    General: She is not in acute distress.    Appearance: Normal appearance. She is well-developed and normal weight. She is not ill-appearing, toxic-appearing or diaphoretic.  HENT:     Head: Normocephalic and atraumatic.     Right Ear: External ear normal.     Left Ear: External ear normal.     Nose: Nose normal.     Mouth/Throat:     Mouth: Mucous membranes are moist.     Pharynx: Oropharynx is clear.  Eyes:     General: No scleral icterus.    Extraocular Movements: Extraocular movements intact.     Pupils: Pupils are equal, round, and reactive to light.  Cardiovascular:     Rate and Rhythm: Normal rate and regular rhythm.     Heart sounds: Normal heart sounds. No murmur. No friction rub. No gallop.   Pulmonary:     Effort: Pulmonary effort is normal. No respiratory distress.     Breath sounds: Normal breath sounds. No stridor. No wheezing, rhonchi or rales.  Abdominal:     General: Bowel sounds are normal. There is no distension.     Palpations: Abdomen is soft. There is no mass.     Tenderness: There is abdominal tenderness (mild, generalized throughout). There is no right CVA tenderness, left CVA tenderness, guarding or rebound.  Skin:    General: Skin is warm and dry.     Coloration: Skin is not pale.     Findings: No rash.  Neurological:     General:  No focal deficit present.     Mental Status: She is alert and oriented to person, place, and time.  Psychiatric:        Mood and Affect: Mood normal.        Behavior: Behavior normal.        Thought Content: Thought content normal.        Judgment: Judgment normal.     Assessment and Plan :   1. Nausea and vomiting, intractability of vomiting not specified, unspecified vomiting type   2. Decreased appetite   3. Marijuana use   4. Dental infection     Discussed possible sources of her nausea and vomiting, malaise.  Recommended she consider cutting back on her marijuana use.  We will stop her amoxicillin and switch to  clindamycin to address this as a possible source of her symptoms.  Low risk for COVID-19, both patient and I agreed to hold off on testing for this. Counseled patient on potential for adverse effects with medications prescribed/recommended today, ER and return-to-clinic precautions discussed, patient verbalized understanding.    Wallis Bamberg, [place] 11/27/18 1413

## 2018-11-27 NOTE — ED Triage Notes (Signed)
Pt was tested for COVID 1 month ago for the same symptoms.

## 2018-11-27 NOTE — ED Triage Notes (Signed)
Pt presents with complaints of abdominal pain, emesis and body aches that started today.

## 2019-05-24 ENCOUNTER — Ambulatory Visit (HOSPITAL_COMMUNITY)
Admission: EM | Admit: 2019-05-24 | Discharge: 2019-05-24 | Disposition: A | Discharge status: Home / Self Care | Payer: Medicaid Other | Attending: Internal Medicine | Admitting: Internal Medicine

## 2019-05-24 ENCOUNTER — Other Ambulatory Visit: Payer: Self-pay

## 2019-05-24 ENCOUNTER — Encounter (HOSPITAL_COMMUNITY): Payer: Self-pay

## 2019-05-24 DIAGNOSIS — T300 Burn of unspecified body region, unspecified degree: Secondary | ICD-10-CM | POA: Diagnosis present

## 2019-05-24 DIAGNOSIS — Z20822 Contact with and (suspected) exposure to covid-19: Secondary | ICD-10-CM | POA: Diagnosis present

## 2019-05-24 MED ORDER — SILVER SULFADIAZINE 1 % EX CREA: 1.0000 "application " | TOPICAL_CREAM | Freq: Every day | CUTANEOUS | 0 refills | Status: DC
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
## 2019-05-25 LAB — SARS CORONAVIRUS 2 (TAT 6-24 HRS): SARS Coronavirus 2: NEGATIVE
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
## 2019-09-16 ENCOUNTER — Ambulatory Visit (HOSPITAL_COMMUNITY)
Admission: EM | Admit: 2019-09-16 | Discharge: 2019-09-16 | Disposition: A | Discharge status: Home / Self Care | Payer: Medicaid Other

## 2019-09-16 ENCOUNTER — Other Ambulatory Visit: Payer: Self-pay

## 2019-09-17 ENCOUNTER — Ambulatory Visit (HOSPITAL_COMMUNITY): Payer: Self-pay

## 2019-09-30 ENCOUNTER — Ambulatory Visit (HOSPITAL_COMMUNITY)
Admission: EM | Admit: 2019-09-30 | Discharge: 2019-09-30 | Disposition: A | Discharge status: Home / Self Care | Payer: Medicaid Other | Attending: Family Medicine | Admitting: Family Medicine

## 2019-09-30 ENCOUNTER — Other Ambulatory Visit: Payer: Self-pay

## 2019-09-30 ENCOUNTER — Encounter (HOSPITAL_COMMUNITY): Payer: Self-pay

## 2019-09-30 DIAGNOSIS — R059 Cough, unspecified: Secondary | ICD-10-CM

## 2019-09-30 DIAGNOSIS — R05 Cough: Secondary | ICD-10-CM | POA: Diagnosis not present

## 2019-09-30 DIAGNOSIS — R430 Anosmia: Secondary | ICD-10-CM | POA: Insufficient documentation

## 2019-09-30 DIAGNOSIS — Z20822 Contact with and (suspected) exposure to covid-19: Secondary | ICD-10-CM | POA: Diagnosis not present

## 2019-09-30 DIAGNOSIS — R0602 Shortness of breath: Secondary | ICD-10-CM | POA: Diagnosis not present

## 2019-09-30 LAB — SARS CORONAVIRUS 2 (TAT 6-24 HRS): SARS Coronavirus 2: POSITIVE — AB

## 2019-09-30 MED ORDER — ALBUTEROL SULFATE HFA 108 (90 BASE) MCG/ACT IN AERS
2.0000 | INHALATION_SPRAY | Freq: Once | RESPIRATORY_TRACT | Status: AC
  Administered 2019-09-30: 2 via RESPIRATORY_TRACT

## 2019-09-30 MED ORDER — BENZONATATE 100 MG PO CAPS: 100.0000 mg | ORAL_CAPSULE | Freq: Three times a day (TID) | ORAL | 0 refills | Status: DC | PRN

## 2019-09-30 MED ORDER — ALBUTEROL SULFATE HFA 108 (90 BASE) MCG/ACT IN AERS
INHALATION_SPRAY | RESPIRATORY_TRACT | Status: AC
  Filled 2019-09-30: qty 6.7

## 2019-09-30 NOTE — ED Triage Notes (Signed)
Pt c/o productive cough w/white mucous, SOB, chills and loss of smellx4 days. Pt has non labored breathing.

## 2019-09-30 NOTE — ED Provider Notes (Signed)
[facility]    CSN: [id number] Arrival date & time: 09/30/19  6160      History   Chief Complaint Chief Complaint  Patient presents with  . COVID sx    HPI Jaime Shelton is a 29 y.o. female.   HPI Encounter for COVID-19 Testing in Symptomatic Patient. Symptoms onset 5 days ago.   Recent Exposure to persons positive for COVID-19: No Afebrile. Symptoms today include, loss of smell, SOB, nasal drainage and congestion, persistent cough. She has taken over the counter anti-cough medications without relief of symptoms.  No history of asthma or bronchitis.  Past Medical History:  Diagnosis Date  . Abdominal pain    Hx: of  . Complication of anesthesia   . Headache(784.0)    Hx: of Migraines  . Heart murmur   . PONV (postoperative nausea and vomiting)   . Shortness of breath    Hx: of    There are no problems to display for this patient.   Past Surgical History:  Procedure Laterality Date  . AXILLARY SURGERY  06/26/2012   cyst removed  . EAR CYST EXCISION Left 06/26/2012   Procedure: excison of left axillary CYST REMOVAL;  Surgeon: Lodema Pilot, DO;  Location: [facility];  Service: General;  Laterality: Left;  . NO PAST SURGERIES    . REFRACTIVE SURGERY      OB History    Gravida  1   Para  1   Term      Preterm      AB      Living  1     SAB      TAB      Ectopic      Multiple      Live Births               Home Medications    Prior to Admission medications   Medication Sig Start Date End Date Taking? Authorizing Provider  silver sulfADIAZINE (SILVADENE) 1 % cream Apply 1 application topically daily. 05/24/19   Lamptey, Britta Mccreedy, MD  promethazine (PHENERGAN) 12.5 MG suppository Place 1 suppository (12.5 mg total) rectally every 6 (six) hours as needed for nausea or vomiting. 11/27/18 05/24/19  Wallis Bamberg, PA-C  venlafaxine (EFFEXOR) 100 MG tablet Take by mouth 2 (two) times daily.  05/24/19  [provider]    Family  History Family History  Problem Relation Age of Onset  . HIV Father   . Hypertension Mother     Social History Social History   Tobacco Use  . Smoking status: Never Smoker  . Smokeless tobacco: Never Used  Vaping Use  . Vaping Use: Never used  Substance Use Topics  . Alcohol use: Yes    Comment: occasional  . Drug use: Yes    Types: Marijuana    Comment: Daily     Allergies   Patient has no known allergies.   Review of Systems Review of Systems Pertinent negatives listed in HPI Physical Exam Triage Vital Signs ED Triage Vitals [09/30/19 0943]  Enc Vitals Group     BP (!) 124/93     Pulse Rate 69     Resp 18     Temp 98.1 F (36.7 C)     Temp Source Oral     SpO2 100 %     Weight 110 lb (49.9 kg)     Height 5\' 4"  (1.626 m)     Head Circumference      Peak  Flow      Pain Score 6     Pain Loc      Pain Edu?      Excl. in GC?    No data found.  Updated Vital Signs BP (!) 124/93   Pulse 69   Temp 98.1 F (36.7 C) (Oral)   Resp 18   Ht 5\' 4"  (1.626 m)   Wt 110 lb (49.9 kg)   SpO2 100%   BMI 18.88 kg/m   Visual Acuity Right Eye Distance:   Left Eye Distance:   Bilateral Distance:    Right Eye Near:   Left Eye Near:    Bilateral Near:     Physical Exam General appearance: alert, well developed, well nourished, cooperative and in no distress Head: Normocephalic, without obvious abnormality, atraumatic Respiratory: Respirations even and unlabored, normal respiratory rate Heart: Rate and rhythm normal. No gallop or murmurs noted on exam  Extremities: No gross deformities Skin: Skin color, texture, turgor normal. No rashes seen  Psych: Appropriate mood and affect. UC Treatments / Results  Labs (all labs ordered are listed, but only abnormal results are displayed) Labs Reviewed  SARS CORONAVIRUS 2 (TAT 6-24 HRS)   EKG   Radiology No results found.  Procedures Procedures (including critical care time)  Medications Ordered in  UC Medications - No data to display  Initial Impression / Assessment and Plan / UC Course  I have reviewed the triage vital signs and the nursing notes.  Pertinent labs & imaging results that were available during my care of the patient were reviewed by me and considered in my medical decision making (see chart for details).     COVID-19 test pending. Work note provided for 24 hours to allow time for test to result. Patient encouraged to self isolate while test is pending. Prescribed benzonatate 100-200 mg TID for cough as needed. Albuterol inhaler 2 puffs every 4-6 hours as needed for shortness of breath. ER if symptoms worsen. Final Clinical Impressions(s) / UC Diagnoses   Final diagnoses:  Suspected COVID-19 virus infection  SOB (shortness of breath)  Loss of smell  Cough     Discharge Instructions     Your COVID 19 results will be available in 24 hours. Negative results are immediately resulted to Mychart. Positive results will receive a follow-up call from our clinic. If symptoms are present, I recommend home quarantine until results are known.     ED Prescriptions    Medication Sig Dispense Auth. Provider   benzonatate (TESSALON) 100 MG capsule Take 1-2 capsules (100-200 mg total) by mouth 3 (three) times daily as needed for cough. 40 capsule , FNP     PDMP not reviewed this encounter.   Bing Neighbors, FNP 09/30/19 1013

## 2019-09-30 NOTE — Discharge Instructions (Signed)
Your COVID 19 results will be available in 24 hours. Negative results are immediately resulted to Mychart. Positive results will receive a follow-up call from our clinic. If symptoms are present, I recommend home quarantine until results are known.  

## 2020-03-26 NOTE — Progress Notes (Shared)
[facility] - Clinic Note  03/30/2020     CHIEF COMPLAINT Patient presents for No chief complaint on file.   HISTORY OF PRESENT ILLNESS: Jaime Shelton is a 30 y.o. female who presents to the clinic today for:   pt states she ran out of lotemax over the weekend   Referring physician: Conrad Chaska, MD No address on file  HISTORICAL INFORMATION:   Selected notes from the MEDICAL RECORD NUMBER    CURRENT MEDICATIONS: No current outpatient medications on file. (Ophthalmic Drugs)   No current facility-administered medications for this visit. (Ophthalmic Drugs)   Current Outpatient Medications (Other)  Medication Sig  . benzonatate (TESSALON) 100 MG capsule Take 1-2 capsules (100-200 mg total) by mouth 3 (three) times daily as needed for cough.  . silver sulfADIAZINE (SILVADENE) 1 % cream Apply 1 application topically daily.   No current facility-administered medications for this visit. (Other)      REVIEW OF SYSTEMS:    ALLERGIES No Known Allergies  PAST MEDICAL HISTORY Past Medical History:  Diagnosis Date  . Abdominal pain    Hx: of  . Complication of anesthesia   . Headache(784.0)    Hx: of Migraines  . Heart murmur   . PONV (postoperative nausea and vomiting)   . Shortness of breath    Hx: of   Past Surgical History:  Procedure Laterality Date  . AXILLARY SURGERY  06/26/2012   cyst removed  . EAR CYST EXCISION Left 06/26/2012   Procedure: excison of left axillary CYST REMOVAL;  Surgeon: Lodema Pilot, DO;  Location: [facility] OR;  Service: General;  Laterality: Left;  . NO PAST SURGERIES    . REFRACTIVE SURGERY      FAMILY HISTORY Family History  Problem Relation Age of Onset  . HIV Father   . Hypertension Mother     SOCIAL HISTORY Social History   Tobacco Use  . Smoking status: Never Smoker  . Smokeless tobacco: Never Used  Vaping Use  . Vaping Use: Never used  Substance Use Topics  . Alcohol use: Yes    Comment:  occasional  . Drug use: Yes    Types: Marijuana    Comment: Daily         OPHTHALMIC EXAM:  Not recorded     IMAGING AND PROCEDURES  Imaging and Procedures for 10/29/17           ASSESSMENT/PLAN:    ICD-10-CM   1. Iritis of right eye  H20.9   2. Blepharoconjunctivitis of right eye, unspecified blepharoconjunctivitis type  H10.501   3. Left retinal detachment  H33.22   4. Amblyopia, left eye  H53.002   5. Retinal edema  H35.81   6. Iritis of left eye  H20.9   7. 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 Iritis and blepharoconjunctivitis OD  - onset: 4.23.20 upon waking  - denies sick contacts or contact with pink eye  - history of iritis w/ blepharoconjunctivitis OS last year  - previously, no subjective or objective improvement on lotemax QID + atropine BID OD  - pt unable to obtain Pred Forte due to insurance reasons  - pt reports subjective stable improvement in symptoms since last visit  - completed Lotemax TID OD  - exam shows no cell/pigment, improved photophobia -- symptoms resolved  - f/u here prn  3. Chronic inferior retinal detachment, OS   - inf temporal  SRF/RD  - s/p laser retinopexy OS (09.27.19) -- good barricade laser surrounding RD to ora  - Optos photos obtained (11.12.19) for comparison  - stable today on exam  4.  Amblyopia OS  - pt reports history of patching as a child  - monitor  5. No retinal edema on exam or OCT   Ophthalmic Meds Ordered this visit:  No orders of the defined types were placed in this encounter.      No follow-ups on file.  There are no Patient Instructions on file for this visit.   Explained the diagnoses, plan, and follow up with the patient and they expressed understanding.  Patient expressed understanding of the importance of proper follow up care.   This document serves as a record of services personally performed by  [name], MD, PhD. It was created on their behalf by Herby Abraham, COA, an ophthalmic technician. The creation of this record is the provider's dictation and/or activities during the visit.    Electronically signed by: Herby Abraham, COA @TODAY @ 2:46 PM    Abbreviations: M myopia (nearsighted); A astigmatism; H hyperopia (farsighted); P presbyopia; Mrx spectacle prescription;  CTL contact lenses; OD right eye; OS left eye; OU both eyes  XT exotropia; ET esotropia; PEK punctate epithelial keratitis; PEE punctate epithelial erosions; DES dry eye syndrome; MGD meibomian gland dysfunction; ATs artificial tears; PFAT's preservative free artificial tears; NSC nuclear sclerotic cataract; PSC posterior subcapsular cataract; ERM epi-retinal membrane; PVD posterior vitreous detachment; RD retinal detachment; DM diabetes mellitus; DR diabetic retinopathy; NPDR non-proliferative diabetic retinopathy; PDR proliferative diabetic retinopathy; CSME clinically significant macular edema; DME diabetic macular edema; dbh dot blot hemorrhages; CWS cotton wool spot; POAG primary open angle glaucoma; C/D cup-to-disc ratio; HVF humphrey visual field; GVF goldmann visual field; OCT optical coherence tomography; IOP intraocular pressure; BRVO Branch retinal vein occlusion; CRVO central retinal vein occlusion; CRAO central retinal artery occlusion; BRAO branch retinal artery occlusion; RT retinal tear; SB scleral buckle; PPV pars plana vitrectomy; VH Vitreous hemorrhage; PRP panretinal laser photocoagulation; IVK intravitreal kenalog; VMT vitreomacular traction; MH Macular hole;  NVD neovascularization of the disc; NVE neovascularization elsewhere; AREDS age related eye disease study; ARMD age related macular degeneration; POAG primary open angle glaucoma; EBMD epithelial/anterior basement membrane dystrophy; ACIOL anterior chamber intraocular lens; IOL intraocular lens; PCIOL posterior chamber intraocular lens; Phaco/IOL  phacoemulsification with intraocular lens placement; PRK photorefractive keratectomy; LASIK laser assisted in situ keratomileusis; HTN hypertension; DM diabetes mellitus; COPD chronic obstructive pulmonary disease

## 2020-03-30 ENCOUNTER — Encounter (INDEPENDENT_AMBULATORY_CARE_PROVIDER_SITE_OTHER): Payer: Medicaid Other | Admitting: Ophthalmology

## 2020-03-30 DIAGNOSIS — H33102 Unspecified retinoschisis, left eye: Secondary | ICD-10-CM

## 2020-03-30 DIAGNOSIS — H209 Unspecified iridocyclitis: Secondary | ICD-10-CM

## 2020-03-30 DIAGNOSIS — H10502 Unspecified blepharoconjunctivitis, left eye: Secondary | ICD-10-CM

## 2020-03-30 DIAGNOSIS — H3581 Retinal edema: Secondary | ICD-10-CM

## 2020-03-30 DIAGNOSIS — H53002 Unspecified amblyopia, left eye: Secondary | ICD-10-CM

## 2020-03-30 DIAGNOSIS — H10501 Unspecified blepharoconjunctivitis, right eye: Secondary | ICD-10-CM

## 2020-03-30 DIAGNOSIS — H3322 Serous retinal detachment, left eye: Secondary | ICD-10-CM

## 2020-07-19 ENCOUNTER — Encounter (HOSPITAL_COMMUNITY): Payer: Self-pay

## 2020-07-19 ENCOUNTER — Emergency Department (HOSPITAL_COMMUNITY): Payer: Medicaid Other

## 2020-07-19 ENCOUNTER — Observation Stay (HOSPITAL_COMMUNITY)
Admission: EM | Admit: 2020-07-19 | Discharge: 2020-07-20 | Disposition: A | Discharge status: Home / Self Care | Payer: Medicaid Other | Attending: Family Medicine | Admitting: Family Medicine

## 2020-07-19 ENCOUNTER — Other Ambulatory Visit: Payer: Self-pay

## 2020-07-19 DIAGNOSIS — Z79899 Other long term (current) drug therapy: Secondary | ICD-10-CM | POA: Insufficient documentation

## 2020-07-19 DIAGNOSIS — R072 Precordial pain: Secondary | ICD-10-CM | POA: Insufficient documentation

## 2020-07-19 DIAGNOSIS — I480 Paroxysmal atrial fibrillation: Secondary | ICD-10-CM | POA: Insufficient documentation

## 2020-07-19 DIAGNOSIS — R739 Hyperglycemia, unspecified: Secondary | ICD-10-CM | POA: Insufficient documentation

## 2020-07-19 DIAGNOSIS — E876 Hypokalemia: Secondary | ICD-10-CM

## 2020-07-19 DIAGNOSIS — R1110 Vomiting, unspecified: Secondary | ICD-10-CM | POA: Diagnosis present

## 2020-07-19 DIAGNOSIS — I4891 Unspecified atrial fibrillation: Secondary | ICD-10-CM

## 2020-07-19 DIAGNOSIS — Y900 Blood alcohol level of less than 20 mg/100 ml: Secondary | ICD-10-CM | POA: Diagnosis not present

## 2020-07-19 DIAGNOSIS — R4182 Altered mental status, unspecified: Secondary | ICD-10-CM | POA: Diagnosis not present

## 2020-07-19 DIAGNOSIS — R112 Nausea with vomiting, unspecified: Secondary | ICD-10-CM | POA: Diagnosis not present

## 2020-07-19 DIAGNOSIS — Z20822 Contact with and (suspected) exposure to covid-19: Secondary | ICD-10-CM | POA: Diagnosis not present

## 2020-07-19 DIAGNOSIS — F1290 Cannabis use, unspecified, uncomplicated: Secondary | ICD-10-CM | POA: Insufficient documentation

## 2020-07-19 DIAGNOSIS — R011 Cardiac murmur, unspecified: Secondary | ICD-10-CM | POA: Insufficient documentation

## 2020-07-19 LAB — CBC
HCT: 39.5 % (ref 36.0–46.0)
Hemoglobin: 13.2 g/dL (ref 12.0–15.0)
MCH: 31.3 pg (ref 26.0–34.0)
MCHC: 33.4 g/dL (ref 30.0–36.0)
MCV: 93.6 fL (ref 80.0–100.0)
Platelets: 236 10*3/uL (ref 150–400)
RBC: 4.22 MIL/uL (ref 3.87–5.11)
RDW: 11.7 % (ref 11.5–15.5)
WBC: 9.2 10*3/uL (ref 4.0–10.5)
nRBC: 0 % (ref 0.0–0.2)

## 2020-07-19 LAB — RAPID URINE DRUG SCREEN, HOSP PERFORMED
Amphetamines: NOT DETECTED
Barbiturates: NOT DETECTED
Benzodiazepines: POSITIVE — AB
Cocaine: NOT DETECTED
Opiates: NOT DETECTED
Tetrahydrocannabinol: POSITIVE — AB

## 2020-07-19 LAB — URINALYSIS, ROUTINE W REFLEX MICROSCOPIC
Bacteria, UA: NONE SEEN
Bilirubin Urine: NEGATIVE
Glucose, UA: 50 mg/dL — AB
Hgb urine dipstick: NEGATIVE
Ketones, ur: 20 mg/dL — AB
Nitrite: NEGATIVE
Protein, ur: NEGATIVE mg/dL
Specific Gravity, Urine: 1.013 (ref 1.005–1.030)
pH: 8 (ref 5.0–8.0)

## 2020-07-19 LAB — MAGNESIUM: Magnesium: 1.7 mg/dL (ref 1.7–2.4)

## 2020-07-19 LAB — RESP PANEL BY RT-PCR (FLU A&B, COVID) ARPGX2
Influenza A by PCR: NEGATIVE
Influenza B by PCR: NEGATIVE
SARS Coronavirus 2 by RT PCR: NEGATIVE

## 2020-07-19 LAB — BASIC METABOLIC PANEL
Anion gap: 11 (ref 5–15)
BUN: 13 mg/dL (ref 6–20)
CO2: 21 mmol/L — ABNORMAL LOW (ref 22–32)
Calcium: 9.1 mg/dL (ref 8.9–10.3)
Chloride: 106 mmol/L (ref 98–111)
Creatinine, Ser: 0.85 mg/dL (ref 0.44–1.00)
GFR, Estimated: >60 mL/min (ref >60)
Glucose, Bld: 133 mg/dL — ABNORMAL HIGH (ref 70–99)
Potassium: 3.2 mmol/L — ABNORMAL LOW (ref 3.5–5.1)
Sodium: 138 mmol/L (ref 135–145)

## 2020-07-19 LAB — ETHANOL: Alcohol, Ethyl (B): <10 mg/dL (ref <10)

## 2020-07-19 LAB — TSH: TSH: 0.546 u[IU]/mL (ref 0.350–4.500)

## 2020-07-19 LAB — HIV ANTIBODY (ROUTINE TESTING W REFLEX): HIV Screen 4th Generation wRfx: NONREACTIVE

## 2020-07-19 LAB — I-STAT BETA HCG BLOOD, ED (MC, WL, AP ONLY): I-stat hCG, quantitative: <5 m[IU]/mL (ref <5)

## 2020-07-19 LAB — TROPONIN I (HIGH SENSITIVITY)
Troponin I (High Sensitivity): 3 ng/L (ref <18)
Troponin I (High Sensitivity): 5 ng/L (ref <18)

## 2020-07-19 LAB — CBG MONITORING, ED: Glucose-Capillary: 132 mg/dL — ABNORMAL HIGH (ref 70–99)

## 2020-07-19 MED ORDER — POTASSIUM CHLORIDE 10 MEQ/100ML IV SOLN
10.0000 meq | INTRAVENOUS | Status: AC
  Administered 2020-07-19 (×2): 10 meq via INTRAVENOUS
  Filled 2020-07-19 (×2): qty 100

## 2020-07-19 MED ORDER — PROCHLORPERAZINE EDISYLATE 10 MG/2ML IJ SOLN
10.0000 mg | Freq: Once | INTRAMUSCULAR | Status: AC
  Administered 2020-07-19: 10 mg via INTRAVENOUS
  Filled 2020-07-19: qty 2

## 2020-07-19 MED ORDER — SODIUM CHLORIDE 0.9 % IV SOLN: INTRAVENOUS | Status: DC

## 2020-07-19 MED ORDER — ONDANSETRON HCL 4 MG PO TABS: 4.0000 mg | ORAL_TABLET | Freq: Four times a day (QID) | ORAL | Status: DC | PRN

## 2020-07-19 MED ORDER — LORAZEPAM 2 MG/ML IJ SOLN
INTRAMUSCULAR | Status: AC
  Administered 2020-07-19: 2 mg via INTRAVENOUS
  Filled 2020-07-19: qty 1

## 2020-07-19 MED ORDER — ONDANSETRON HCL 4 MG/2ML IJ SOLN
4.0000 mg | Freq: Once | INTRAMUSCULAR | Status: AC
  Administered 2020-07-19: 4 mg via INTRAVENOUS
  Filled 2020-07-19: qty 2

## 2020-07-19 MED ORDER — SODIUM CHLORIDE 0.9 % IV BOLUS
500.0000 mL | Freq: Once | INTRAVENOUS | Status: AC
Start: 2020-07-19 — End: 2020-07-19
  Administered 2020-07-19: 500 mL via INTRAVENOUS

## 2020-07-19 MED ORDER — POTASSIUM CHLORIDE 10 MEQ/100ML IV SOLN
10.0000 meq | INTRAVENOUS | Status: DC
  Administered 2020-07-19: 10 meq via INTRAVENOUS
  Filled 2020-07-19: qty 100

## 2020-07-19 MED ORDER — ONDANSETRON HCL 4 MG/2ML IJ SOLN: 4.0000 mg | Freq: Four times a day (QID) | INTRAMUSCULAR | Status: DC | PRN

## 2020-07-19 MED ORDER — ACETAMINOPHEN 325 MG PO TABS: 650.0000 mg | ORAL_TABLET | Freq: Four times a day (QID) | ORAL | Status: DC | PRN

## 2020-07-19 MED ORDER — LORAZEPAM 2 MG/ML IJ SOLN: 2.0000 mg | Freq: Once | INTRAMUSCULAR | Status: AC

## 2020-07-19 MED ORDER — ACETAMINOPHEN 650 MG RE SUPP: 650.0000 mg | Freq: Four times a day (QID) | RECTAL | Status: DC | PRN

## 2020-07-19 MED ORDER — ENOXAPARIN SODIUM 40 MG/0.4ML IJ SOSY
40.0000 mg | PREFILLED_SYRINGE | Freq: Every day | INTRAMUSCULAR | Status: DC
  Administered 2020-07-19 – 2020-07-20 (×2): 40 mg via SUBCUTANEOUS
  Filled 2020-07-19 (×2): qty 0.4

## 2020-07-19 NOTE — Hospital Course (Addendum)
Jaime Shelton is a 30 y.o. female presented with intractable nausea &vomiting. PMH is significant for Marijuana use.    Vomiting  Chest Pain  Shaking Patient presented with intractable nausea and vomiting and had concerns for  seizures.  Patient did not lose consciousness at this time.  Received dose of Versed in EMS transport.  Received 500 mL NS bolus dose in ED, Ativan 2 mg. Pregnancy test is negative, COVID negative.  Head CT is unremarkable.  Lab evaluation shows a mild hypokalemia but no other acute findings.  CT head shows no acute change and no sign of hemorrhage.  Possible new onset A-fib.  Shaking may be due to fluid loss repeated nausea but could be new onset seizures. Could also be Marijuana Hyperemesis syndrome.  Given diarrhea possible viral illness that may have preceded these symptoms.   Paroxysmal A-Fib Noted on initial EKG in ED with HR 140-190.  Patient did endorse palpitations/chest pain. On repeat EKG after CT, converted to NS. ED Physician spoke with Cardiology who recommended ECHO but did not feel needed to see and recommended monitoring.  Cardiology consulted again and they suspect transient atrial fibrillation in the setting of GI illness.  [place] score 0 and there is no need for oral anticoagulation.    Hypokalemia-resolved K-3.2.  Likely due to repeated emesis.    Hyperglycemia Glu 133 on BMP. HbA1c pending

## 2020-07-19 NOTE — ED Notes (Signed)
Pt continues to feel nauseated

## 2020-07-19 NOTE — Progress Notes (Addendum)
The resident discussed the patient with me, and I reviewed the chart. 29 Y/O with PMX of recurrent ED visits for N/V, abdominal pain, recent COVID-19 infection, and heart murmur presents with intractable vomiting and seizure-like activity described as tremors. Unclear if there was an actual loss of consciousness.  Vitals:   07/19/20 1100 07/19/20 1130 07/19/20 1200 07/19/20 1230  BP: (!) 138/92 109/76 125/67 122/82  Pulse: 87 87 84 91  Resp: 13 19 18 13   SpO2: 100% 99% 98% 100%     Lab: K+ 3.2,  Trops 3 > 5, Istat hcg <5, chest xray negative, CT head neg Utox positive for benzo and THC. EKG: Afib @121  bpm; repeat EKG shows NSR.  A/P: discussed with the resident. I will evaluate the patient and complete H&P as soon as possible.  Intractable nausea and vomiting: Recurrent - multiple ED visits for similar presentation. It may be due to her Marijuana intake - need to counsel her regarding marijuana intake. Afebrile, and COVID-19 is negative. Obtain DG/CT abdomen if abdominal pain to assess for appendicitis/bowel obstruction. Her vitals is stable, which is reassuring. Keep NPO for now with maintenance IVF. Monitor hemodynamic status closely.  Seizure-like activity: Per the record review, this was described as tremors. Differentials include hypoglycemia from excessive vomiting, actual seizures, vasovagal or autonomic reflexes, and cardiac dysrhythmias. EKG initially showed Afib with RVR but resolved. ?? New PAF is triggered by excessive vomiting with hypovolemia. Per the resident, cardiology was consulted in the ED and were not impressed. ECHO recommended. Recheck EKG and troponin. Monitor cardiac activity on telemetry. Resident to re-consult Cards for assessment given documented PMX of heart murmur and chest pain for this admission. CT head is negative for intracranial pathology. Order EEG - consult Neuro if positive. Otherwise, she can f/u neuro as an outpatient. Implement seizures and  fall precautions. Obtain hx of alcohol abuse and r/o withdrawal.   Mild hypokalemia: from GI loss Replete and recheck.  Monitor closely in the stepdown unit.

## 2020-07-19 NOTE — ED Notes (Signed)
Pt up to the bedside commode.  She c/o dizziness when she stood up, but once back in bed, she was no longer dizzy.  No ekg changes during that time.

## 2020-07-19 NOTE — ED Triage Notes (Signed)
Pt BIB [facility], pt was driving herself to work began to not "feel right" then attempted to drive herself to [facility] ED when she pulled over and called 911. On EMS arrival FD felt pt was having what appeared to be a panic attack and vomiting in the middle of the road.  Pt pale, cool, diaphoretic upon EMS arrival. Pt lifted from car placed on stretcher and monitor, A-fib 160. Pt then began to have seizure like activity and HR spiking to 190. Pt given 5mg  Versed and 500 cc NS    BP 130/84 HR 140-190 RR 22-30 98% RA  CBG 128  20G LFA

## 2020-07-19 NOTE — Consult Note (Signed)
CARDIOLOGY CONSULT NOTE  Patient ID: Jaime Shelton MRN: [id number] DOB/AGE: 1990/10/19 30 y.o.  Admit date: 07/19/2020 Attending physician: Doreene Eland, MD Primary Physician:  Conrad Hamlet, MD Outpatient Cardiologist: None Inpatient Cardiologist: Tessa Lerner, DO, [facility]  Reason of consultation: Cardiac murmur and chest pain  Referring physician: Doreene Eland, MD  Chief complaint: Nausea / vomiting, Chest pain, A. fib, seizure-like activity/tremors  HPI:  [name] is a 30 y.o. African-American female who presents with a chief complaint of "chest pain, nausea, vomiting." Her past medical history and cardiovascular risk factors include: cardiac murmur, marijuana use.  She started experiencing nausea and vomiting since 5 AM initially thought it was secondary to food poisoning and was driving herself to [facility] long hospital for evaluation and management.  However due to continued nausea and vomiting and feeling lightheaded she pulled aside on the road EMS was called patient was evaluated and brought to [facility] instead.  As per the ER physician patient was having tremor-like activities concerning for seizures when EMS arrived and initial EKG had noted A. fib with RVR.  Patient was stabilized and had gone down to CT she had another nausea/vomiting episode and converted to normal sinus rhythm.  Since her vomiting this morning patient was complaining of chest pain as well.  Cardiac troponins were negative x2.  Cardiology consulted for evaluation of chest pain and prior history of a cardiac murmur.  At the time of the evaluation patient is awake and alert and able to provide a history of present illness.  Patient denies any chest pain at rest, not brought on by effort related activities, does not resolve with rest.  She thinks this was most likely associated with prolonged nausea and vomiting.  While growing up patient used to dance and was very physically active and never has  had a syncopal event.  No family history of premature CAD or sudden cardiac death.  Patient smokes marijuana on a daily basis at least 4 blunts.  Her mother Diamond Nickel was present via cellular communication during this encounter.   ALLERGIES: No Known Allergies  PAST MEDICAL HISTORY: Past Medical History:  Diagnosis Date   Abdominal pain    Hx: of   Complication of anesthesia    Headache(784.0)    Hx: of Migraines   Heart murmur    PONV (postoperative nausea and vomiting)    Shortness of breath    Hx: of    PAST SURGICAL HISTORY: Past Surgical History:  Procedure Laterality Date   AXILLARY SURGERY  06/26/2012   cyst removed   EAR CYST EXCISION Left 06/26/2012   Procedure: excison of left axillary CYST REMOVAL;  Surgeon: Lodema Pilot, DO;  Location: [facility] OR;  Service: General;  Laterality: Left;   NO PAST SURGERIES     REFRACTIVE SURGERY      FAMILY HISTORY: The patient family history includes HIV in her father; Hypertension in her mother.  No family history of premature CAD.   SOCIAL HISTORY:  The patient  reports that she has never smoked. She has never used smokeless tobacco. She reports current alcohol use. She reports current drug use. Drug: Marijuana.  MEDICATIONS: Current Outpatient Medications  Medication Instructions   benzonatate (TESSALON) 100-200 mg, Oral, 3 times daily PRN   silver sulfADIAZINE (SILVADENE) 1 % cream 1 application, Topical, Daily    REVIEW OF SYSTEMS: Review of Systems  Constitutional: Negative for chills and fever.  HENT:  Negative for hoarse voice and nosebleeds.   Eyes:  Negative for discharge, double vision and pain.  Cardiovascular:  Positive for chest pain (on presentation, now resolved). Negative for claudication, dyspnea on exertion, leg swelling, near-syncope, orthopnea, palpitations, paroxysmal nocturnal dyspnea and syncope.  Respiratory:  Negative for hemoptysis and shortness of breath.   Musculoskeletal:  Negative for muscle  cramps and myalgias.  Gastrointestinal:  Positive for nausea and vomiting. Negative for abdominal pain, constipation, diarrhea, hematemesis, hematochezia and melena.  Neurological:  Negative for dizziness and light-headedness.  All other systems reviewed and are negative.  PHYSICAL EXAM: Vitals with BMI 07/19/2020 07/19/2020 07/19/2020  Height - - -  Weight - - -  BMI - - -  Systolic 122 125 505  Diastolic 82 67 76  Pulse 91 84 87     Intake/Output Summary (Last 24 hours) at 07/19/2020 1522 Last data filed at 07/19/2020 1011 Gross per 24 hour  Intake 500 ml  Output --  Net 500 ml    Net IO Since Admission: 500 mL [07/19/20 1522]  CONSTITUTIONAL: Age-appropriate female, hemodynamically stable, no acute distress.   SKIN: Skin is warm and dry. No rash noted. No cyanosis. No pallor. No jaundice HEAD: Normocephalic and atraumatic.  EYES: No scleral icterus MOUTH/THROAT: Moist oral membranes.  NECK: No JVD present. No thyromegaly noted. No carotid bruits  LYMPHATIC: No visible cervical adenopathy.  CHEST Normal respiratory effort. No intercostal retractions.  Pacer pads present. LUNGS: Clear to auscultation bilaterally.  No stridor. No wheezes. No rales.  CARDIOVASCULAR: Regular, positive S1-S2, soft systolic murmur heard, no gallops or rubs. ABDOMINAL: Nonobese, soft, nontender, nondistended, positive bowel sounds in all 4 quadrants.  No apparent ascites.  EXTREMITIES: No peripheral edema, warm to touch, 2+ dorsalis pedis and posterior tibial pulses. HEMATOLOGIC: No significant bruising NEUROLOGIC: Oriented to person, place, and time. Nonfocal. Normal muscle tone.  PSYCHIATRIC: Normal mood and affect. Normal behavior. Cooperative  RADIOLOGY: CT Head Wo Contrast  Result Date: 07/19/2020 CLINICAL DATA:  Mental status change with unknown cause EXAM: CT HEAD WITHOUT CONTRAST TECHNIQUE: Contiguous axial images were obtained from the base of the skull through the vertex without  intravenous contrast. COMPARISON:  08/25/2010 FINDINGS: Brain: No evidence of acute infarction, hemorrhage, hydrocephalus, extra-axial collection or mass lesion/mass effect. Vascular: No hyperdense vessel or unexpected calcification. Skull: Normal. Negative for fracture or focal lesion. Sinuses/Orbits: No acute finding. IMPRESSION: Negative head CT. Electronically Signed   By: Marnee Spring M.D.   On: 07/19/2020 08:44   DG Chest Port 1 View  Result Date: 07/19/2020 CLINICAL DATA:  Atrial fibrillation EXAM: PORTABLE CHEST 1 VIEW COMPARISON:  07/26/2010 FINDINGS: Defibrillator leads in place. Artifact from EKG leads. There is no edema, consolidation, effusion, or pneumothorax. Scoliosis. IMPRESSION: Stable portable chest. Electronically Signed   By: Marnee Spring M.D.   On: 07/19/2020 08:02    LABORATORY DATA: Lab Results  Component Value Date   WBC 9.2 07/19/2020   HGB 13.2 07/19/2020   HCT 39.5 07/19/2020   MCV 93.6 07/19/2020   PLT 236 07/19/2020    Recent Labs  Lab 07/19/20 0732  NA 138  K 3.2*  CL 106  CO2 21*  BUN 13  CREATININE 0.85  CALCIUM 9.1  GLUCOSE 133*    Lipid Panel  No results found for: CHOL, HDL, LDLCALC, LDLDIRECT, TRIG, CHOLHDL  BNP (last 3 results) No results for input(s): BNP in the last 8760 hours.  HEMOGLOBIN A1C No results found for: HGBA1C, MPG  Cardiac Panel (last 3 results) Recent Labs    07/19/20 0732  07/19/20 1011  TROPONINIHS 3 5     TSH Recent Labs    07/19/20 0732  TSH 0.546     CARDIAC DATABASE: EKG: 07/19/2020 702am: Atrial fibrillation with rapid ventricular rate, 121 bpm, normal axis, LVH per voltage criteria, ST-T changes most likely secondary to either rate or voltage. 07/19/2020 8:37 AM: Normal sinus rhythm, 78 bpm, normal axis, without underlying ischemia or injury pattern  Echocardiogram: None  IMPRESSION & RECOMMENDATIONS: Queenie Aufiero is a 30 y.o. African-American female whose past medical history and  cardiovascular risk factors include: Marijuana use, history of cardiac murmur.  Precordial pain: Appears to be noncardiac in etiology.  Present since her nausea and vomiting started earlier this morning. Currently chest pain-free. High sensitive troponin negative x2. EKG does not illustrate evidence of injury pattern. Echocardiogram pending. Since she is chest pain-free, high sensitive troponins are negative x2, recommend an echocardiogram to evaluate for LVEF, regional wall motion abnormalities, and valvular heart disease.   If the LVEF is preserved with no regional wall motion abnormalities and no significant valvular heart disease I would recommend outpatient work-up for her precordial pain if symptoms resurface.   Patient and mother are agreeable with the plan of care.  Atrial fibrillation with rapid ventricular rate: Currently normal sinus rhythm. Suspect alone / transient A. fib in the setting of GI upset and spontaneously converted to sinus rhythm shortly thereafter and no prior history.  CHA2DS2-VASc SCORE is 0.  Long-term oral anticoagulation not indicated.  Cardiac murmur: Soft systolic murmur appreciated but may also be secondary to rate related.  Echocardiogram pending.  Marijuana use: Urine drug screen positive for marijuana use.  Educated on the importance of complete marijuana cessation.  Patient is aware that use of illicit drugs can lead to coronary vasospasm as well as coronary dissection.  Secondary diagnoses: Hypokalemia: Management per primary team. Tremors/possible seizures: Management per primary team  Total encounter time 88 minutes.  Plan of care discussed with the patient, her mother, and family medicine resident.  *Total Encounter Time as defined by the Centers for Medicare and Medicaid Services includes, in addition to the face-to-face time of a patient visit (documented in the note above) non-face-to-face time: obtaining and reviewing outside history,  ordering and reviewing medications, tests or procedures, care coordination (communications with other health care professionals or caregivers) and documentation in the medical record.  Patient's questions and concerns were addressed to her satisfaction. She voices understanding of the instructions provided during this encounter.   This note was created using a voice recognition software as a result there may be grammatical errors inadvertently enclosed that do not reflect the nature of this encounter. Every attempt is made to correct such errors.  Delilah Shan [facility]  Pager: [phone] Office: [phone] 07/19/2020, 3:22 PM

## 2020-07-19 NOTE — ED Notes (Signed)
Pt continues C/O nausea and dizziness. Resident notified.

## 2020-07-19 NOTE — ED Notes (Signed)
Cardiologist at bedside.  

## 2020-07-19 NOTE — ED Provider Notes (Signed)
[facility] EMERGENCY DEPARTMENT Provider Note   CSN: [id number] Arrival date & time: 07/19/20  0092     History Chief Complaint  Patient presents with   Emesis   Nausea    Natania Finigan is a 30 y.o. female.  HPI  30 year old female presents the emergency department by EMS.  Patient was reportedly woke up this morning not feeling well, decided to try to drive herself to [facility], ER.  While driving she reportedly began to not "feel right".  She had to pull over and call 911.  EMS states on arrival the fire department felt that she was having what appeared to be a panic attack and vomited in the middle of the road.  EMS evaluated the patient and found her to be pale, diaphoretic.  The patient was placed on a stretcher and this is when they noticed her heart rate was atrial fibrillation with rates in the 160s.  That is when EMS states that she began to have whole body shaking with heart rate spiking to the 190s.  She was given Versed and IV fluids prior to arrival.  CBG reported to be normal.  Patient arrives sedated from the Versed but protecting her airway.  Past Medical History:  Diagnosis Date   Abdominal pain    Hx: of   Complication of anesthesia    Headache(784.0)    Hx: of Migraines   Heart murmur    PONV (postoperative nausea and vomiting)    Shortness of breath    Hx: of    There are no problems to display for this patient.   Past Surgical History:  Procedure Laterality Date   AXILLARY SURGERY  06/26/2012   cyst removed   EAR CYST EXCISION Left 06/26/2012   Procedure: excison of left axillary CYST REMOVAL;  Surgeon: Lodema Pilot, DO;  Location: [facility] OR;  Service: General;  Laterality: Left;   NO PAST SURGERIES     REFRACTIVE SURGERY       OB History     Gravida  1   Para  1   Term      Preterm      AB      Living  1      SAB      IAB      Ectopic      Multiple      Live Births              Family History   Problem Relation Age of Onset   HIV Father    Hypertension Mother     Social History   Tobacco Use   Smoking status: Never   Smokeless tobacco: Never  Vaping Use   Vaping Use: Never used  Substance Use Topics   Alcohol use: Yes    Comment: occasional   Drug use: Yes    Types: Marijuana    Comment: Daily    Home Medications Prior to Admission medications   Medication Sig Start Date End Date Taking? Authorizing Provider  benzonatate (TESSALON) 100 MG capsule Take 1-2 capsules (100-200 mg total) by mouth 3 (three) times daily as needed for cough. 09/30/19   Bing Neighbors, FNP  silver sulfADIAZINE (SILVADENE) 1 % cream Apply 1 application topically daily. 05/24/19   Lamptey, Britta Mccreedy, MD  promethazine (PHENERGAN) 12.5 MG suppository Place 1 suppository (12.5 mg total) rectally every 6 (six) hours as needed for nausea or vomiting. 11/27/18 05/24/19  Wallis Bamberg, PA-C  venlafaxine (  EFFEXOR) 100 MG tablet Take by mouth 2 (two) times daily.  05/24/19  [provider]    Allergies    Patient has no known allergies.  Review of Systems   Review of Systems  Unable to perform ROS: Acuity of condition   Physical Exam Updated Vital Signs There were no vitals taken for this visit.  Physical Exam Vitals and nursing note reviewed.  Constitutional:      Appearance: She is not diaphoretic.     Comments: Arouses to name, very drowsy, protecting her airway  HENT:     Head: Normocephalic.     Mouth/Throat:     Mouth: Mucous membranes are moist.  Eyes:     Pupils: Pupils are equal, round, and reactive to light.  Cardiovascular:     Rate and Rhythm: Tachycardia present.  Pulmonary:     Effort: Pulmonary effort is normal. No respiratory distress.     Breath sounds: No rales.  Abdominal:     Palpations: Abdomen is soft.     Tenderness: There is no abdominal tenderness.  Musculoskeletal:        General: No swelling.  Skin:    General: Skin is warm.  Neurological:      Comments: Sedated from prearrival Versed  Psychiatric:        Mood and Affect: Mood normal.    ED Results / Procedures / Treatments   Labs (all labs ordered are listed, but only abnormal results are displayed) Labs Reviewed  BASIC METABOLIC PANEL  CBC  MAGNESIUM  TSH  CBG MONITORING, ED  I-STAT BETA HCG BLOOD, ED ([facility], [facility], [facility] ONLY)  TROPONIN I (HIGH SENSITIVITY)    EKG EKG Interpretation  Date/Time:  Sunday July 19 2020 07:02:52 EDT Ventricular Rate:  121 PR Interval:    QRS Duration: 79 QT Interval:  299 QTC Calculation: 425 R Axis:   60 Text Interpretation: Atrial fibrillation LVH by voltage Atrial fibrillation, new Confirmed by Tyjuan Demetro ([id number]) on 07/19/2020 7:18:11 AM  Radiology No results found.  Procedures Procedures   Medications Ordered in ED Medications  LORazepam (ATIVAN) injection 2 mg (2 mg Intravenous Given 07/19/20 0720)    ED Course  I have reviewed the triage vital signs and the nursing notes.  Pertinent labs & imaging results that were available during my care of the patient were reviewed by me and considered in my medical decision making (see chart for details).    MDM Rules/Calculators/A&P                          29  year old female presents to the emergency department by EMS.  She is currently drowsy and sedated from preverbal Versed that was given for seizure-like activity.  She is noted to be in atrial fibrillation with heart rates 110s to 140s on the monitor.  Stable blood pressure.  She does arouse to her name, is protecting her airway, normal oxygenation on room air.  No traumatic findings on evaluation.  No reported alcohol or drug use.  Pregnancy test is negative, head CT is unremarkable.  Lab evaluation shows a mild hypokalemia but no other acute findings.  When patient was coming back from CT she sat up in bed, had an episode of emesis and has since converted herself back to normal sinus rhythm.  Blood pressure is stable.  She  continues to be a little sedated from the Versed but otherwise appears to be improving.  She is still complaining  of nausea.  Patient's initial presentation and story seems concerning especially in combination with arrhythmias or possible new onset seizures.  Patients evaluation and results requires admission for further treatment and care. Patient agrees with admission plan, offers no new complaints and is stable/unchanged at time of admit.  Final Clinical Impression(s) / ED Diagnoses Final diagnoses:  None    Rx / DC Orders ED Discharge Orders     None        Rozelle Logan, DO 07/19/20 1248

## 2020-07-19 NOTE — ED Notes (Signed)
Pt back from CT.  Per staff, once pt was placed back on stretcher, she sat up and vomited all over the floor.  Pt now in NSR.  EKG given to Dr Wilkie Aye.

## 2020-07-19 NOTE — ED Notes (Signed)
Attempted report 

## 2020-07-19 NOTE — H&P (Addendum)
Family Medicine Teaching [facility] Admission History and Physical Service Pager: [phone]  Patient name: Jaime Shelton Medical record number: [id number] Date of birth: February 24, 1990 Age: 30 y.o. Gender: female  Primary Care Provider: Conrad Largo, MD Consultants: None Code Status: Full Preferred Emergency Contact: Mom  Chief Complaint: Nausea/Vomiting  Assessment and Plan: Jaime Shelton is a 30 y.o. female presenting with nausea/vomiting. PMH is significant for Marijuana use.    Vomiting  Chest Pain  Shaking  Vomiting started this morning as did chest pain.  After several episodes patient called EMS, and noted that patient was shaking with concerns for seizures.  Patient did not lose consciousness at this time.  Received dose of Versed in EMS transport.  Received 500 mL NS bolus dose in ED, Ativan 2 mg, and .  Currently hemodynamically stable with normal HR.  Initial EKG showed patient with A-Fib rhythm with RVR.  Repeat EKG shows Normal sinus rhythm with abnormal Q waves. Pregnancy test is negative, COVID negative.  Head CT is unremarkable.  Lab evaluation shows a mild hypokalemia but no other acute findings.  CT head shows no acute change and no sign of hemorrhage.  Possible new onset A-fib.  Shaking may be due to fluid loss repeated nausea but could be new onset seizures.  Will obtain EEG and consult Neurology if abnormal or if repeat episode. Could also be Marijuana Hyperemesis syndrome.  Given diarrhea possible viral illness that may have preceded these symptoms.  Plan to admit for observation and continued work-up. - Admit to FPTS, Attending Dr Lum Babe - Continuous Cardiac Monitoring - follow up EEG - f/u ETOH lvl - mIVF NS - follow up A1C, HIV - AM BMP, CBC - Zofran 4 mg PRN for nausea - VS per unit  Paroxysmal A-Fib Noted on initial EKG in ED with HR 140-190.  Patient did nedorse palpitations/chest pain this morning.  On repeat EKG after CT, converted to NS. ED  Physician spoke with Cardiology who recommended ECHO but did not feel needed to see and recommended monitoring. - Obtain ECHO - Cardiac telelmetry monitoring  Hypokalemia K-3.2.  Likely due to repeated emesis. - Replete IV KCl - AM BMP  Hyperglycemia Glu 133 on BMP.  -F/u A1c -AM BMP  FEN/GI: NPO Prophylaxis: Lovenox  Disposition: Med/Tele  History of Present Illness:  Jaime Shelton is a 30 y.o. female presenting with vomiting.  Woke up this morning and started to feel nauseous. Started having emesis which did not stop. Started to turn yellow. Attempted to get herself to the hospital but could not. Does not remember EMS coming to get her.   Endorses associated diarrhea several days prior. Did not eat prior to vomiting this morning. Otherwise was tolerating a normal diet prior to this. Noticed some blood tinged emesis. Progressed from food to yellow/clear.  Denies alcohol use. Endorses marijuana use daily in the morning.   Review Of Systems: Per HPI with the following additions:  Review of Systems  Constitutional:  Positive for diaphoresis. Negative for appetite change and fever.  HENT:  Negative for congestion.   Respiratory:  Negative for cough and shortness of breath.   Cardiovascular:  Positive for chest pain and palpitations.  Gastrointestinal:  Positive for diarrhea.    There are no problems to display for this patient.   Past Medical History: Past Medical History:  Diagnosis Date   Abdominal pain    Hx: of   Complication of anesthesia    Headache(784.0)    Hx: of Migraines  Heart murmur    PONV (postoperative nausea and vomiting)    Shortness of breath    Hx: of    Past Surgical History: Past Surgical History:  Procedure Laterality Date   AXILLARY SURGERY  06/26/2012   cyst removed   EAR CYST EXCISION Left 06/26/2012   Procedure: excison of left axillary CYST REMOVAL;  Surgeon: Lodema Pilot, DO;  Location: [facility] OR;  Service: General;  Laterality: Left;    NO PAST SURGERIES     REFRACTIVE SURGERY      Social History: Social History   Tobacco Use   Smoking status: Never   Smokeless tobacco: Never  Vaping Use   Vaping Use: Never used  Substance Use Topics   Alcohol use: Yes    Comment: occasional   Drug use: Yes    Types: Marijuana    Comment: Daily   Additional social history:  Please also refer to relevant sections of EMR.  Family History: Family History  Problem Relation Age of Onset   HIV Father    Hypertension Mother    No family history of heart problems  Allergies and Medications: No Known Allergies No current facility-administered medications on file prior to encounter.   Current Outpatient Medications on File Prior to Encounter  Medication Sig Dispense Refill   benzonatate (TESSALON) 100 MG capsule Take 1-2 capsules (100-200 mg total) by mouth 3 (three) times daily as needed for cough. (Patient not taking: No sig reported) 40 capsule 0   silver sulfADIAZINE (SILVADENE) 1 % cream Apply 1 application topically daily. (Patient not taking: No sig reported) 50 g 0   [DISCONTINUED] promethazine (PHENERGAN) 12.5 MG suppository Place 1 suppository (12.5 mg total) rectally every 6 (six) hours as needed for nausea or vomiting. 20 each 0   [DISCONTINUED] venlafaxine (EFFEXOR) 100 MG tablet Take by mouth 2 (two) times daily.      Objective: BP 125/67   Pulse 84   Resp 18   SpO2 98%  Exam:  Physical Exam Constitutional:      General: She is not in acute distress.    Appearance: Normal appearance.  HENT:     Head: Normocephalic and atraumatic.     Mouth/Throat:     Mouth: Mucous membranes are moist.  Cardiovascular:     Rate and Rhythm: Normal rate and regular rhythm.     Pulses: Normal pulses.  Pulmonary:     Effort: Pulmonary effort is normal.     Breath sounds: Normal breath sounds.  Abdominal:     General: Abdomen is flat. There is no distension.     Palpations: Abdomen is soft.     Tenderness: There is no  abdominal tenderness.  Skin:    General: Skin is warm.  Neurological:     General: No focal deficit present.     Mental Status: She is alert and oriented to person, place, and time.     Cranial Nerves: No cranial nerve deficit.     Sensory: No sensory deficit.     Motor: No weakness.  Psychiatric:        Mood and Affect: Mood normal.        Thought Content: Thought content normal.     Labs and Imaging: CBC BMET  Recent Labs  Lab 07/19/20 0732  WBC 9.2  HGB 13.2  HCT 39.5  PLT 236   Recent Labs  Lab 07/19/20 0732  NA 138  K 3.2*  CL 106  CO2 21*  BUN 13  CREATININE 0.85  GLUCOSE 133*  CALCIUM 9.1     EKG: NS rhythm with abnormal Q waves  PORTABLE CHEST 1 VIEW COMPARISON:  07/26/2010 IMPRESSION: Stable portable chest.  CT HEAD WITHOUT CONTRAST COMPARISON:  08/25/2010 IMPRESSION: Negative head CT.   Jovita Kussmaul, MD 07/19/2020, 12:30 PM PGY-1, [facility] Family Medicine FPTS Intern pager: [phone], text pages welcome   FPTS Upper-Level Resident Addendum   I have independently interviewed and examined the patient. I have discussed the above with the original author and agree with their documentation. My edits for correction/addition/clarification are within the document. Please see also any attending notes.   Lavonda Jumbo, DO PGY-2, [facility] 07/19/2020 2:47 PM  FPTS Service pager: [phone] (text pages welcome through [facility])

## 2020-07-19 NOTE — Progress Notes (Signed)
Received pt from ED, GCS 15 fully alert appears in no distress denies nausea discomforts and abdominal pain.

## 2020-07-20 ENCOUNTER — Observation Stay (HOSPITAL_COMMUNITY): Payer: Medicaid Other

## 2020-07-20 ENCOUNTER — Encounter (HOSPITAL_COMMUNITY): Payer: Self-pay | Admitting: Family Medicine

## 2020-07-20 ENCOUNTER — Observation Stay (HOSPITAL_BASED_OUTPATIENT_CLINIC_OR_DEPARTMENT_OTHER): Payer: Medicaid Other

## 2020-07-20 DIAGNOSIS — R112 Nausea with vomiting, unspecified: Principal | ICD-10-CM

## 2020-07-20 DIAGNOSIS — R569 Unspecified convulsions: Secondary | ICD-10-CM

## 2020-07-20 DIAGNOSIS — I4891 Unspecified atrial fibrillation: Secondary | ICD-10-CM

## 2020-07-20 DIAGNOSIS — E876 Hypokalemia: Secondary | ICD-10-CM | POA: Diagnosis not present

## 2020-07-20 LAB — ECHOCARDIOGRAM COMPLETE
Area-P 1/2: 2.5 cm2
S' Lateral: 2.7 cm

## 2020-07-20 LAB — CBC
HCT: 33.3 % — ABNORMAL LOW (ref 36.0–46.0)
Hemoglobin: 11.4 g/dL — ABNORMAL LOW (ref 12.0–15.0)
MCH: 31.4 pg (ref 26.0–34.0)
MCHC: 34.2 g/dL (ref 30.0–36.0)
MCV: 91.7 fL (ref 80.0–100.0)
Platelets: 249 10*3/uL (ref 150–400)
RBC: 3.63 MIL/uL — ABNORMAL LOW (ref 3.87–5.11)
RDW: 11.8 % (ref 11.5–15.5)
WBC: 6.6 10*3/uL (ref 4.0–10.5)
nRBC: 0 % (ref 0.0–0.2)

## 2020-07-20 LAB — BASIC METABOLIC PANEL
Anion gap: 5 (ref 5–15)
BUN: 7 mg/dL (ref 6–20)
CO2: 22 mmol/L (ref 22–32)
Calcium: 8.5 mg/dL — ABNORMAL LOW (ref 8.9–10.3)
Chloride: 109 mmol/L (ref 98–111)
Creatinine, Ser: 0.68 mg/dL (ref 0.44–1.00)
GFR, Estimated: >60 mL/min (ref >60)
Glucose, Bld: 74 mg/dL (ref 70–99)
Potassium: 3.9 mmol/L (ref 3.5–5.1)
Sodium: 136 mmol/L (ref 135–145)

## 2020-07-20 LAB — HEMOGLOBIN A1C
Hgb A1c MFr Bld: 5.1 % (ref 4.8–5.6)
Mean Plasma Glucose: 100 mg/dL

## 2020-07-20 NOTE — Progress Notes (Signed)
Pt IV removed, cathter intact. Tele removed, ccmd aware. Pt  has all belongings and understands d/c instructions. Pt d/c via wheelchair by NT.

## 2020-07-20 NOTE — Progress Notes (Signed)
Family Medicine Teaching Service Daily Progress Note Intern Pager: [phone]  Patient name: Jaime Shelton Medical record number: [id number] Date of birth: 1990-12-08 Age: 30 y.o. Gender: female  Primary Care Provider: Conrad Longview, MD Consultants: Cardiology Code Status: Full  Pt Overview and Major Events to Date:  07/19/2020: Admitted to FPTS  Assessment and Plan: Jaime Shelton 30 year old female presented with intractable nausea and vomiting.  PMH significant for marijuana use.  Jaime Shelton is a 30 y.o. female presented with intractable nausea &vomiting.    Intractable nausea and vomiting  No nausea this a.m. and no further episodes of vomiting since yesterday.  Patient also had 5 to 6 days of watery diarrhea before onset of nausea vomiting so still considering either viral episode versus marijuana hyperemesis syndrome. --Vitals per floor --Diet liberated to full liquid and then as tolerated --Marijuana cessation counseling  Seizure-like activity Possibly due to intractable nausea and vomiting but ordered EEG to rule out any possibility of seizures. --F/u EEG   A. fib with RVR EKG on admission with HR 140-190. No chest pain or palpitations today.  Repeat EKG after CT, converted to normal sinus rhythm. Cardiology consulted and they suspect transient atrial fibrillation in the setting of GI episode.  [place] score 0 and there is no need for oral anticoagulation. -- f/u Echocardiogram -- Telemetry monitoring  Hypokalemia-resolved K+ 3.9 --BMP in a.m.  Hyperglycemia Blood glucose 74 this a.m. --Monitor closely --f/u HbA1c    FEN/GI: Regular diet PPx: Lovenox40 mg   Status is: Observation  The patient remains OBS appropriate and will d/c before 2 midnights.  Dispo: The patient is from: Home              Anticipated d/c is to: Home              Patient currently is medically stable to d/c.   Difficult to place patient No        Subjective:  No acute  overnight events. Evaluated at bedside this morning and denies any new complaints and states feels ready to go home.  Objective: Temp:  [97.7 F (36.5 C)-98.9 F (37.2 C)] 98.7 F (37.1 C) (06/13 1212) Pulse Rate:  [65-104] 104 (06/13 1212) Resp:  [15-22] 16 (06/13 1212) BP: (100-144)/(66-107) 144/107 (06/13 1212) SpO2:  [98 %-100 %] 100 % (06/13 1212) Physical Exam: General: Pleasant female sitting comfortably in bed, NAD Cardiovascular: RRR, soft systolic murmur + Respiratory: Clear to auscultation bilaterally Abdomen: Soft, nondistended, nontender Extremities: No edema appreciated  Laboratory: Recent Labs  Lab 07/19/20 0732 07/20/20 0704  WBC 9.2 6.6  HGB 13.2 11.4*  HCT 39.5 33.3*  PLT 236 249   Recent Labs  Lab 07/19/20 0732 07/20/20 0704  NA 138 136  K 3.2* 3.9  CL 106 109  CO2 21* 22  BUN 13 7  CREATININE 0.85 0.68  CALCIUM 9.1 8.5*  GLUCOSE 133* 74    Imaging/Diagnostic Tests: No results found.   Arnoldo Lenis, MD 07/20/2020, 1:29 PM PGY-1, [facility] Family Medicine FPTS Intern pager: [phone], text pages welcome

## 2020-07-20 NOTE — Progress Notes (Signed)
Progress Note  Patient Name: Jaime Shelton Date of Encounter: 07/20/2020  Attending physician: Doreene Eland, MD Primary care provider: Conrad Escobares, MD Primary Cardiologist: None  Consultant:Columbia Pandey Odis Hollingshead, DO  Subjective: Jaime Shelton is a 30 y.o. female who was seen and examined at bedside at approximately 815 No events overnight per both patient and RN.   Resting in bed comfortably. Patient denies any chest pain, shortness of breath at rest or with effort related activities.  Objective: Vital Signs in the last 24 hours: Temp:  [97.7 F (36.5 C)-98.9 F (37.2 C)] 97.7 F (36.5 C) (06/13 0451) Pulse Rate:  [65-97] 71 (06/13 0451) Resp:  [11-22] 18 (06/13 0451) BP: (100-141)/(60-92) 119/84 (06/13 0451) SpO2:  [98 %-100 %] 100 % (06/13 0451)  Intake/Output:  Intake/Output Summary (Last 24 hours) at 07/20/2020 0908 Last data filed at 07/19/2020 1746 Gross per 24 hour  Intake 600 ml  Output --  Net 600 ml    Net IO Since Admission: 600 mL [07/20/20 0908]  Weights:  There were no vitals filed for this visit.  Telemetry: Personally reviewed.  Normal sinus rhythm without dysrhythmias  Physical examination: PHYSICAL EXAM: Vitals with BMI 07/20/2020 07/20/2020 07/19/2020  Height - - -  Weight - - -  BMI - - -  Systolic 119 121 500  Diastolic 84 78 72  Pulse 71 65 76    CONSTITUTIONAL: Age-appropriate female, hemodynamically stable, no acute distress.   SKIN: Skin is warm and dry. No rash noted. No cyanosis. No pallor. No jaundice HEAD: Normocephalic and atraumatic. EYES: No scleral icterus MOUTH/THROAT: Moist oral membranes. NECK: No JVD present. No thyromegaly noted. No carotid bruits LYMPHATIC: No visible cervical adenopathy. CHEST Normal respiratory effort. No intercostal retractions.  Pacer pads present. LUNGS: Clear to auscultation bilaterally.  No stridor. No wheezes. No rales. CARDIOVASCULAR: Regular, positive S1-S2, soft systolic murmur heard, no  gallops or rubs. ABDOMINAL: Nonobese, soft, nontender, nondistended, positive bowel sounds in all 4 quadrants.  No apparent ascites. EXTREMITIES: No peripheral edema, warm to touch, 2+ dorsalis pedis and posterior tibial pulses. HEMATOLOGIC: No significant bruising NEUROLOGIC: Oriented to person, place, and time. Nonfocal. Normal muscle tone. PSYCHIATRIC: Normal mood and affect. Normal behavior. Cooperative  Lab Results: Hematology Recent Labs  Lab 07/19/20 0732  WBC 9.2  RBC 4.22  HGB 13.2  HCT 39.5  MCV 93.6  MCH 31.3  MCHC 33.4  RDW 11.7  PLT 236    Chemistry Recent Labs  Lab 07/19/20 0732  NA 138  K 3.2*  CL 106  CO2 21*  GLUCOSE 133*  BUN 13  CREATININE 0.85  CALCIUM 9.1  GFRNONAA >60  ANIONGAP 11     Cardiac Enzymes: Cardiac Panel (last 3 results) Recent Labs    07/19/20 0732 07/19/20 1011  TROPONINIHS 3 5    BNP (last 3 results) No results for input(s): BNP in the last 8760 hours.  ProBNP (last 3 results) No results for input(s): PROBNP in the last 8760 hours.   DDimer No results for input(s): DDIMER in the last 168 hours.   Hemoglobin A1c: No results found for: HGBA1C, MPG  TSH  Recent Labs    07/19/20 0732  TSH 0.546    Lipid Panel No results found for: CHOL, TRIG, HDL, CHOLHDL, VLDL, LDLCALC, LDLDIRECT  Imaging: CT Head Wo Contrast  Result Date: 07/19/2020 CLINICAL DATA:  Mental status change with unknown cause EXAM: CT HEAD WITHOUT CONTRAST TECHNIQUE: Contiguous axial images were obtained from the base of the skull  through the vertex without intravenous contrast. COMPARISON:  08/25/2010 FINDINGS: Brain: No evidence of acute infarction, hemorrhage, hydrocephalus, extra-axial collection or mass lesion/mass effect. Vascular: No hyperdense vessel or unexpected calcification. Skull: Normal. Negative for fracture or focal lesion. Sinuses/Orbits: No acute finding. IMPRESSION: Negative head CT. Electronically Signed   By: Marnee Spring M.D.    On: 07/19/2020 08:44   DG Chest Port 1 View  Result Date: 07/19/2020 CLINICAL DATA:  Atrial fibrillation EXAM: PORTABLE CHEST 1 VIEW COMPARISON:  07/26/2010 FINDINGS: Defibrillator leads in place. Artifact from EKG leads. There is no edema, consolidation, effusion, or pneumothorax. Scoliosis. IMPRESSION: Stable portable chest. Electronically Signed   By: Marnee Spring M.D.   On: 07/19/2020 08:02    Cardiac database: EKG: 07/19/2020 702am: Atrial fibrillation with rapid ventricular rate, 121 bpm, normal axis, LVH per voltage criteria, ST-T changes most likely secondary to either rate or voltage. 07/19/2020 8:37 AM: Normal sinus rhythm, 78 bpm, normal axis, without underlying ischemia or injury pattern   Echocardiogram: None  Scheduled Meds:  enoxaparin (LOVENOX) injection  40 mg Subcutaneous Daily    Continuous Infusions:  sodium chloride 75 mL/hr at 07/20/20 0804    PRN Meds: acetaminophen **OR** acetaminophen, ondansetron **OR** ondansetron (ZOFRAN) IV   IMPRESSION & RECOMMENDATIONS: [name] is a 30 y.o. female whose past medical history and cardiac risk factors include: Marijuana use, history of cardiac murmur.  Precordial pain: Present on admission currently resolved most likely secondary to intractable nausea and vomiting. EKG did not illustrate myocardial injury pattern. High sensitive troponins negative x2. Echocardiogram pending. If the LVEF is preserved with no regional wall motion abnormalities and no significant valvular heart disease I would recommend outpatient work-up for her precordial pain if symptoms resurface.   She is chest pain free since yesterday.   Atrial fibrillation with rapid ventricular rate: Present on admission and spontaneously converted to normal sinus rhythm. Suspect alone/transient A. fib in the setting of GI upset CHA2DS2-VASc score 0 No need for oral anticoagulation.  Cardiac murmur: Echocardiogram pending  Marijuana use: Educated  on importance of complete marijuana cessation.  Hypokalemia: Present on admission.  Morning labs pending.  Managed by primary team.  Tremors/possible seizures: Nursing staff notes EEG is pending.  Management per primary team  Total time spent: 35 minutes.   Patient's questions and concerns were addressed to her satisfaction. She voices understanding of the instructions provided during this encounter.   This note was created using a voice recognition software as a result there may be grammatical errors inadvertently enclosed that do not reflect the nature of this encounter. Every attempt is made to correct such errors.  Delilah Shan [facility]  Pager: [phone] Office: [phone] 07/20/2020, 9:08 AM

## 2020-07-20 NOTE — Progress Notes (Signed)
OT Cancellation Note  Patient Details Name: Jaime Shelton MRN: [id number] DOB: 1990-07-14   Cancelled Treatment:    Reason Eval/Treat Not Completed: OT screened, no needs identified, will sign off Per PT, pt Independent with ADLs/mobility with no formal OT eval needed.  Lorre Munroe 07/20/2020, 10:56 AM

## 2020-07-20 NOTE — Progress Notes (Signed)
  Echocardiogram 2D Echocardiogram has been performed.  Jaime Shelton 07/20/2020, 2:39 PM

## 2020-07-20 NOTE — Progress Notes (Signed)
EEG complete - results pending 

## 2020-07-20 NOTE — Progress Notes (Signed)
FPTS Interim Progress Note  Patient sleeping and resting comfortably.  Rounded with primary RN.  No concerns voiced. No seizure activity overnight. No orders required.  Appreciated nightly round.  Today's Vitals   07/19/20 1834 07/19/20 2021 07/20/20 0009 07/20/20 0451  BP:  111/72 121/78 119/84  Pulse:  76 65 71  Resp:  18 18 18   Temp:  98.6 F (37 C) 98 F (36.7 C) 97.7 F (36.5 C)  TempSrc:  Oral Oral Oral  SpO2:  100% 100% 100%  PainSc: 0-No pain       , MD 07/20/2020, 6:09 AM PGY-2, [facility] Family Medicine Service pager [phone]

## 2020-07-20 NOTE — Discharge Instructions (Signed)
Thank you for letting us care for you during your stay.  You were admitted to the [facility] Medicine Teaching Service.   You were admitted for vomiting and irregularly fast  heart rate (atrial fibrillation).  We recommend follow up specifically for chest pain with cardiologist if your chest pain returns.   Please follow up with your primary care physician in 1 week.   If your symptoms worsen or return, please return to the hospital.  Please let us know if you have questions about your stay at [facility].

## 2020-07-20 NOTE — Evaluation (Signed)
Physical Therapy Evaluation Patient Details Name: Jaime Shelton MRN: [id number] DOB: Aug 08, 1990 Today's Date: 07/20/2020   History of Present Illness  29 Y/O with PMX of recurrent ED visits for N/V, abdominal pain, recent COVID-19 infection, and heart murmur presents with intractable vomiting and seizure-like activity described as tremors.  Clinical Impression  Patient evaluated by Physical Therapy with no further acute PT needs identified. All education has been completed and the patient has no further questions. Managing flight of stairs and community amb distances without difficulty;  See below for any follow-up Physical Therapy or equipment needs. PT is signing off. Thank you for this referral.  OK for dc home from PT standpoint      Follow Up Recommendations No PT follow up    Equipment Recommendations  None recommended by PT    Recommendations for Other Services       Precautions / Restrictions Precautions Precautions: None Restrictions Weight Bearing Restrictions: No      Mobility  Bed Mobility Overal bed mobility: Independent                  Transfers Overall transfer level: Independent                  Ambulation/Gait Ambulation/Gait assistance: Independent Gait Distance (Feet): 400 Feet Assistive device: None Gait Pattern/deviations: WFL(Within Functional Limits) Gait velocity: WNL   General Gait Details: No difficulty  Stairs Stairs: Yes Stairs assistance: Modified independent (Device/Increase time) Stair Management: No rails;Alternating pattern;Forwards Number of Stairs: 12 General stair comments: No difficulty  Wheelchair Mobility    Modified Rankin (Stroke Patients Only)       Balance Overall balance assessment: Independent                                           Pertinent Vitals/Pain Pain Assessment: No/denies pain    Home Living Family/patient expects to be discharged to:: Private  residence Living Arrangements: Spouse/significant other Available Help at Discharge: Family Type of Home: House Home Access: Stairs to enter Entrance Stairs-Rails: None Secretary/administrator of Steps: 10 Home Layout: Two level;1/2 bath on main level;Bed/bath upstairs Home Equipment: None      Prior Function Level of Independence: Independent         Comments: Works at home; 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        Extremity/Trunk Assessment   Upper Extremity Assessment Upper Extremity Assessment: Overall WFL for tasks assessed (including intact finger to nose while wlaking)    Lower Extremity Assessment Lower Extremity Assessment: Overall WFL for tasks assessed    Cervical / Trunk Assessment Cervical / Trunk Assessment: Normal  Communication   Communication: No difficulties  Cognition Arousal/Alertness: Awake/alert Behavior During Therapy: WFL for tasks assessed/performed Overall Cognitive Status: Within Functional Limits for tasks assessed                                        General Comments General comments (skin integrity, edema, etc.): HR 82-87bpm with amb and stairs; no reports of lightheadedness; thirsty    Exercises     Assessment/Plan    PT Assessment Patent does not need any further PT services  PT Problem List         PT Treatment Interventions  PT Goals (Current goals can be found in the Care Plan section)  Acute Rehab PT Goals Patient Stated Goal: HOpes to get home today PT Goal Formulation: All assessment and education complete, DC therapy    Frequency     Barriers to discharge        Co-evaluation               AM-PAC PT "6 Clicks" Mobility  Outcome Measure Help needed turning from your back to your side while in a flat bed without using bedrails?: None Help needed moving from lying on your back to sitting on the side of a flat bed without using bedrails?: None Help needed moving to and from a  bed to a chair (including a wheelchair)?: None Help needed standing up from a chair using your arms (e.g., wheelchair or bedside chair)?: None Help needed to walk in hospital room?: None Help needed climbing 3-5 steps with a railing? : None 6 Click Score: 24    End of Session   Activity Tolerance: Patient tolerated treatment well Patient left: in bed;Other (comment) (Can manage independently in room) Nurse Communication: Mobility status (OK for dc) PT Visit Diagnosis: Other abnormalities of gait and mobility (R26.89)    Time: 3354-5625 PT Time Calculation (min) (ACUTE ONLY): 19 min   Charges:   PT Evaluation $PT Eval Low Complexity: 1 Low          Van Clines, PT  Acute Rehabilitation Services Pager [phone] Office [phone]   Jaime Shelton 07/20/2020, 10:00 AM

## 2020-07-20 NOTE — Discharge Summary (Signed)
Family Medicine Teaching [facility] Discharge Summary  Jaime name: Jaime Shelton Medical record number: [id number] Date of birth: 09/09/1990 Age: 30 y.o. Gender: female Date of Admission: 07/19/2020  Date of Discharge: 07/20/2020 Admitting Physician: Jovita Kussmaul, MD  Primary Care Provider: Conrad Delaware, MD Consultants: Cardiology  Indication for Hospitalization: Intractable nausea and vomiting, likely due to marijuana use  Discharge Diagnoses/Problem List:  Nausea and vomiting, likely due to marijuana use Paroxysmal A. Fib Hypokalemia Hyperglycemia  Disposition: Home  Discharge Condition: Stable  Discharge Exam:  General: Pleasant young female sitting comfortably in bed, NAD Cardiovascular: Regular rate and rhythm Respiratory: Clear to auscultation bilaterally Abdomen: Soft, nontender, nondistended, bowel sounds present Extremities: No edema appreciated Skin: Warm and dry Psych: Good mood and affect  Brief Hospital Course:  Jaime Shelton is a 30 y.o. female presented with intractable nausea &vomiting. PMH is significant for Marijuana use.    Vomiting  Chest Pain  Shaking Jaime presented with intractable nausea and vomiting and had concerns for  seizures.  Jaime did not lose consciousness at this time.  Received dose of Versed in EMS transport.  Received 500 mL NS bolus dose in ED, Ativan 2 mg. Pregnancy test is negative, COVID negative.  Head CT is unremarkable.  Lab evaluation shows a mild hypokalemia but no other acute findings.  CT head shows no acute change and no sign of hemorrhage.  Possible new onset A-fib.  Shaking may be due to fluid loss repeated nausea but could be new onset seizures. Could also be Marijuana Hyperemesis syndrome.  Given diarrhea possible viral illness that may have preceded these symptoms.   Paroxysmal A-Fib Noted on initial EKG in ED with HR 140-190.  Jaime did endorse palpitations/chest pain. On repeat EKG after CT, converted  to NS. ED Physician spoke with Cardiology who recommended ECHO but did not feel needed to see and recommended monitoring.  Cardiology consulted again and they suspect transient atrial fibrillation in the setting of GI illness.  [place] score 0 and there is no need for oral anticoagulation.    Hypokalemia-resolved K-3.2.  Likely due to repeated emesis.    Hyperglycemia Glu 133 on BMP. HbA1c pending   Issues for Follow Up:  CBC and BMP in 1 week  PCP appointment in  1week F/u HbA1c  Cardiology outpatient follow up if chest pain re-surfaces  Significant Procedures: None  Significant Labs and Imaging:  Recent Labs  Lab 07/19/20 0732 07/20/20 0704  WBC 9.2 6.6  HGB 13.2 11.4*  HCT 39.5 33.3*  PLT 236 249   Recent Labs  Lab 07/19/20 0732 07/20/20 0704  NA 138 136  K 3.2* 3.9  CL 106 109  CO2 21* 22  GLUCOSE 133* 74  BUN 13 7  CREATININE 0.85 0.68  CALCIUM 9.1 8.5*  MG 1.7  --     CT Head Wo Contrast  Result Date: 07/19/2020 CLINICAL DATA:  Mental status change with unknown cause EXAM: CT HEAD WITHOUT CONTRAST TECHNIQUE: Contiguous axial images were obtained from the base of the skull through the vertex without intravenous contrast. COMPARISON:  08/25/2010 FINDINGS: Brain: No evidence of acute infarction, hemorrhage, hydrocephalus, extra-axial collection or mass lesion/mass effect. Vascular: No hyperdense vessel or unexpected calcification. Skull: Normal. Negative for fracture or focal lesion. Sinuses/Orbits: No acute finding. IMPRESSION: Negative head CT. Electronically Signed   By: Marnee Spring M.D.   On: 07/19/2020 08:44   DG Chest Port 1 View  Result Date: 07/19/2020 CLINICAL DATA:  Atrial fibrillation EXAM: PORTABLE  CHEST 1 VIEW COMPARISON:  07/26/2010 FINDINGS: Defibrillator leads in place. Artifact from EKG leads. There is no edema, consolidation, effusion, or pneumothorax. Scoliosis. IMPRESSION: Stable portable chest. Electronically Signed   By: Marnee SpringJonathon  Watts M.D.    On: 07/19/2020 08:02   EEG adult  Result Date: 07/20/2020 Charlsie QuestYadav, Priyanka O, MD     07/20/2020  2:49 PM Jaime Name: Jaime EhlersOlivia Shelton MRN: [id number] Epilepsy Attending: Charlsie QuestPriyanka O Yadav Referring Physician/Provider: Dr Jovita KussmaulPhilip Maness Date: 07/20/2020 Duration: 22.29 mins Jaime history: 30 year old female with seizure-like activity.  EEG to evaluate for seizures. Level of alertness: Awake, asleep AEDs during EEG study: None Technical aspects: This EEG study was done with scalp electrodes positioned according to the 10-20 International system of electrode placement. Electrical activity was acquired at a sampling rate of 500Hz  and reviewed with a high frequency filter of 70Hz  and a low frequency filter of 1Hz . EEG data were recorded continuously and digitally stored. Description: The posterior dominant rhythm consists of 10 Hz activity of moderate voltage (25-35 uV) seen predominantly in posterior head regions, symmetric and reactive to eye opening and eye closing. Sleep was characterized by vertex waves, sleep spindles (12 to 14 Hz), maximal frontocentral region. Hyperventilation and photic stimulation were not performed.   IMPRESSION: This study is within normal limits. No seizures or epileptiform discharges were seen throughout the recording. Charlsie Questriyanka O Yadav   ECHOCARDIOGRAM COMPLETE  Result Date: 07/20/2020    ECHOCARDIOGRAM REPORT   Jaime Shelton Date of Exam: 07/20/2020 Medical Rec #:  [id number]      Height:       64.0 in Accession #:    [id number]     Weight:       110.0 lb Date of Birth:  [birth year]      BSA:          1.517 m Jaime Age:    29 years       BP:           119/84 mmHg Jaime Gender: F              HR:           62 bpm. Exam Location:  Inpatient Procedure: 2D Echo, Cardiac Doppler and Color Doppler Indications:    I48.91* Unspeicified atrial fibrillation  History:        Jaime has no prior history of Echocardiogram examinations.  Sonographer:    Eulah PontSarah Pirrotta RDCS  Referring Phys: [id number] KRISTIE M HORTON IMPRESSIONS  1. Left ventricular ejection fraction, by estimation, is 60 to 65%. The left ventricle has normal function. The left ventricle has no regional wall motion abnormalities. Left ventricular diastolic parameters were normal.  2. Right ventricular systolic function is normal. The right ventricular size is normal. There is normal pulmonary artery systolic pressure. The estimated right ventricular systolic pressure is 27.2 mmHg.  3. The mitral valve is normal in structure. No evidence of mitral valve regurgitation. No evidence of mitral stenosis.  4. The aortic valve is tricuspid. Aortic valve regurgitation is not visualized. No aortic stenosis is present.  5. The inferior vena cava is normal in size with greater than 50% respiratory variability, suggesting right atrial pressure of 3 mmHg. FINDINGS  Left Ventricle: Left ventricular ejection fraction, by estimation, is 60 to 65%. The left ventricle has normal function. The left ventricle has no regional wall motion abnormalities. The left ventricular internal cavity size was normal in size. There is  no left ventricular hypertrophy. Left ventricular  diastolic parameters were normal. Right Ventricle: The right ventricular size is normal. No increase in right ventricular wall thickness. Right ventricular systolic function is normal. There is normal pulmonary artery systolic pressure. The tricuspid regurgitant velocity is 2.46 m/s, and  with an assumed right atrial pressure of 3 mmHg, the estimated right ventricular systolic pressure is 27.2 mmHg. Left Atrium: Left atrial size was normal in size. Right Atrium: Right atrial size was normal in size. Pericardium: There is no evidence of pericardial effusion. Mitral Valve: The mitral valve is normal in structure. No evidence of mitral valve regurgitation. No evidence of mitral valve stenosis. Tricuspid Valve: The tricuspid valve is normal in structure. Tricuspid valve  regurgitation is mild. Aortic Valve: The aortic valve is tricuspid. Aortic valve regurgitation is not visualized. No aortic stenosis is present. Pulmonic Valve: The pulmonic valve was not well visualized. Pulmonic valve regurgitation is trivial. Aorta: The aortic root and ascending aorta are structurally normal, with no evidence of dilitation. Venous: The inferior vena cava is normal in size with greater than 50% respiratory variability, suggesting right atrial pressure of 3 mmHg. IAS/Shunts: The interatrial septum was not well visualized.  LEFT VENTRICLE PLAX 2D LVIDd:         4.20 cm  Diastology LVIDs:         2.70 cm  LV e' medial:    12.40 cm/s LV PW:         0.90 cm  LV E/e' medial:  7.3 LV IVS:        1.00 cm  LV e' lateral:   11.20 cm/s LVOT diam:     2.10 cm  LV E/e' lateral: 8.1 LV SV:         78 LV SV Index:   51 LVOT Area:     3.46 cm  RIGHT VENTRICLE RV S prime:     12.60 cm/s TAPSE (M-mode): 2.6 cm LEFT ATRIUM             Index       RIGHT ATRIUM           Index LA diam:        2.30 cm 1.52 cm/m  RA Area:     13.90 cm LA Vol (A2C):   36.7 ml 24.19 ml/m RA Volume:   34.90 ml  23.00 ml/m LA Vol (A4C):   34.2 ml 22.54 ml/m LA Biplane Vol: 37.6 ml 24.78 ml/m  AORTIC VALVE LVOT Vmax:   110.00 cm/s LVOT Vmean:  73.900 cm/s LVOT VTI:    0.225 m  AORTA Ao Root diam: 3.00 cm Ao Asc diam:  2.70 cm MITRAL VALVE               TRICUSPID VALVE MV Area (PHT): 2.50 cm    TR Peak grad:   24.2 mmHg MV Decel Time: 304 msec    TR Vmax:        246.00 cm/s MV E velocity: 90.70 cm/s MV A velocity: 59.70 cm/s  SHUNTS MV E/A ratio:  1.52        Systemic VTI:  0.22 m                            Systemic Diam: 2.10 cm Epifanio Lesches MD Electronically signed by Epifanio Lesches MD Signature Date/Time: 07/20/2020/5:34:20 PM    Final      Results/Tests Pending at Time of Discharge: HbA1c  Discharge Medications:  Allergies as of 07/20/2020  No Known Allergies      Medication List     STOP taking these  medications    benzonatate 100 MG capsule Commonly known as: TESSALON   silver sulfADIAZINE 1 % cream Commonly known as: SILVADENE        Discharge Instructions: Please refer to Jaime Instructions section of EMR for full details.  Jaime was counseled important signs and symptoms that should prompt return to medical care, changes in medications, dietary instructions, activity restrictions, and follow up appointments.   Follow-Up Appointments:  Follow-up Information     Conrad Hadar, MD. Schedule an appointment as soon as possible for a visit in 1 week(s).   Specialty: Internal Medicine        Odis Hollingshead, Sunit, DO Follow up in 1 week(s).   Specialties: Cardiology, Vascular Surgery Contact information: [address] [place] [address] [phone]                 Arnoldo Lenis, MD 07/20/2020, 7:23 PM PGY-1, [facility] Family Medicine

## 2020-07-20 NOTE — Procedures (Signed)
Patient Name: Shawnda Mauney  MRN: [id number]  Epilepsy Attending: Charlsie Quest  Referring Physician/Provider: Dr Jovita Kussmaul Date: 07/20/2020 Duration: 22.29 mins  Patient history: 30 year old female with seizure-like activity.  EEG to evaluate for seizures.  Level of alertness: Awake, asleep  AEDs during EEG study: None  Technical aspects: This EEG study was done with scalp electrodes positioned according to the 10-20 International system of electrode placement. Electrical activity was acquired at a sampling rate of 500Hz  and reviewed with a high frequency filter of 70Hz  and a low frequency filter of 1Hz . EEG data were recorded continuously and digitally stored.   Description: The posterior dominant rhythm consists of 10 Hz activity of moderate voltage (25-35 uV) seen predominantly in posterior head regions, symmetric and reactive to eye opening and eye closing. Sleep was characterized by vertex waves, sleep spindles (12 to 14 Hz), maximal frontocentral region. Hyperventilation and photic stimulation were not performed.     IMPRESSION: This study is within normal limits. No seizures or epileptiform discharges were seen throughout the recording.  Taejah Ohalloran 

## 2020-07-28 NOTE — Progress Notes (Signed)
Date:  07/29/2020   ID:  Jaime Shelton, DOB Jul 25, 1990, MRN [id number]  PCP:  Conrad Tarrant, MD  Cardiologist:  Tessa Lerner, DO, [facility]  (established care 07/29/2020) Former Cardiology Providers: Roger Shelter, MD    Chief Complaint  Patient presents with   Atrial Fibrillation   New Patient (Initial Visit)    HPI  Jaime Shelton is a 30 y.o. female who presents to the office with a chief complaint of " hospital follow-up." Patient's past medical history and cardiovascular risk factors include: Transient atrial fibrillation, marijuana use.  She is referred to the office at the request of Conrad , MD for evaluation of atrial fibrillation.  Patient was initially seen in the hospital in 07/19/2020 for evaluation and management of chest pain and atrial fibrillation.  On the day of admission patient was experiencing nausea and vomiting most likely secondary to food poisoning and was brought to [facility] ED via EMS.  The initial EKG noted A. fib with RVR and then few minutes later spontaneously converted to normal sinus rhythm.  Patient symptoms of chest pain appeared to be nonanginal and her high sensitive cardiac troponin markers were negative x2.  Patient did not have any reoccurrence of chest pain during her hospitalization.  Echocardiogram noted preserved LVEF without any significant valvular heart disease.  She smokes marijuana on a daily basis at least 4 blunts.  FUNCTIONAL STATUS: No structured exercise program or daily routine.   ALLERGIES: No Known Allergies  MEDICATION LIST PRIOR TO VISIT: Current Meds  Medication Sig   ibuprofen (ADVIL) 800 MG tablet Take 800 mg by mouth as needed.     PAST MEDICAL HISTORY: Past Medical History:  Diagnosis Date   Abdominal pain    Hx: of   Complication of anesthesia    Headache(784.0)    Hx: of Migraines   Heart murmur    PONV (postoperative nausea and vomiting)    Shortness of breath    Hx: of    PAST  SURGICAL HISTORY: Past Surgical History:  Procedure Laterality Date   AXILLARY SURGERY  06/26/2012   cyst removed   EAR CYST EXCISION Left 06/26/2012   Procedure: excison of left axillary CYST REMOVAL;  Surgeon: Lodema Pilot, DO;  Location: [facility] OR;  Service: General;  Laterality: Left;   NO PAST SURGERIES     REFRACTIVE SURGERY      FAMILY HISTORY: The patient family history includes HIV in her father; Hypertension in her mother.  SOCIAL HISTORY:  The patient  reports that she has never smoked. She has never used smokeless tobacco. She reports previous alcohol use. She reports current drug use. Drug: Marijuana.  REVIEW OF SYSTEMS: Review of Systems  Constitutional: Negative for chills and fever.  HENT:  Negative for hoarse voice and nosebleeds.   Eyes:  Negative for discharge, double vision and pain.  Cardiovascular:  Positive for chest pain. Negative for claudication, dyspnea on exertion, leg swelling, near-syncope, orthopnea, palpitations, paroxysmal nocturnal dyspnea and syncope.  Respiratory:  Negative for hemoptysis and shortness of breath.   Musculoskeletal:  Negative for muscle cramps and myalgias.  Gastrointestinal:  Negative for abdominal pain, constipation, diarrhea, hematemesis, hematochezia, melena, nausea and vomiting.  Neurological:  Negative for dizziness and light-headedness.   PHYSICAL EXAM: Vitals with BMI 07/29/2020 07/20/2020 07/20/2020  Height 5\' 4"  - -  Weight 116 lbs 6 oz - -  BMI 19.97 - -  Systolic 125 141  Diastolic 85 84 107  Pulse 78 60 104  CONSTITUTIONAL: Well-developed and well-nourished. No acute distress.  SKIN: Skin is warm and dry. No rash noted. No cyanosis. No pallor. No jaundice HEAD: Normocephalic and atraumatic.  EYES: No scleral icterus MOUTH/THROAT: Moist oral membranes.  NECK: No JVD present. No thyromegaly noted. No carotid bruits  LYMPHATIC: No visible cervical adenopathy.  CHEST Normal respiratory effort. No intercostal  retractions  LUNGS: Clear to auscultation bilaterally. No stridor. No wheezes. No rales.  CARDIOVASCULAR: Regular rate and rhythm, positive S1-S2, no murmurs rubs or gallops appreciated. ABDOMINAL: No apparent ascites.  EXTREMITIES: No peripheral edema  HEMATOLOGIC: No significant bruising NEUROLOGIC: Oriented to person, place, and time. Nonfocal. Normal muscle tone.  PSYCHIATRIC: Normal mood and affect. Normal behavior. Cooperative  CARDIAC DATABASE: EKG: 07/19/2020 702am: Atrial fibrillation with rapid ventricular rate, 121 bpm, normal axis, LVH per voltage criteria, ST-T changes most likely secondary to either rate or voltage. 07/19/2020 8:37 AM: Normal sinus rhythm, 78 bpm, normal axis, without underlying ischemia or injury pattern 07/29/2020: Normal sinus rhythm, 71 bpm, LVH per voltage criteria, without underlying ischemia or injury pattern.  Echocardiogram: 07/20/2020:  1. Left ventricular ejection fraction, by estimation, is 60 to 65%. The left ventricle has normal function. The left ventricle has no regional wall motion abnormalities. Left ventricular diastolic parameters were normal.   2. Right ventricular systolic function is normal. The right ventricular size is normal. There is normal pulmonary artery systolic pressure. The estimated right ventricular systolic pressure is 27.2 mmHg.   3. The mitral valve is normal in structure. No evidence of mitral valve regurgitation. No evidence of mitral stenosis.   4. The aortic valve is tricuspid. Aortic valve regurgitation is not visualized. No aortic stenosis is present.   5. The inferior vena cava is normal in size with greater than 50%  respiratory variability, suggesting right atrial pressure of 3 mmHg.  Stress Testing: No results found for this or any previous visit from the past 1095 days.  Heart Catheterization: None  LABORATORY DATA: CBC Latest Ref Rng & Units 07/20/2020 07/19/2020 11/06/2016  WBC 4.0 - 10.5 K/uL 6.6 9.2 7.4   Hemoglobin 12.0 - 15.0 g/dL 11.4(L) 13.2 12.6  Hematocrit 36.0 - 46.0 % 33.3(L) 39.5 36.0  Platelets 150 - 400 K/uL 249 236 272    CMP Latest Ref Rng & Units 07/20/2020 07/19/2020 11/06/2016  Glucose 70 - 99 mg/dL 74 166(A) 97  BUN 6 - 20 mg/dL 7 13 10   Creatinine 0.44 - 1.00 mg/dL 6.30 1.60  Sodium 135 - 145 mmol/L 136 138 141  Potassium 3.5 - 5.1 mmol/L 3.9 3.2(L) 3.8  Chloride 98 - 111 mmol/L 109 106 108  CO2 22 - 32 mmol/L 22 21(L) 24  Calcium 8.9 - 10.3 mg/dL 1.09) 9.1 9.2  Total Protein 6.5 - 8.1 g/dL - - 7.7  Total Bilirubin 0.3 - 1.2 mg/dL - - 0.5  Alkaline Phos 38 - 126 U/L - - 42  AST 15 - 41 U/L - - 24  ALT 14 - 54 U/L - - 17    Lipid Panel  No results found for: CHOL, TRIG, HDL, CHOLHDL, VLDL, LDLCALC, LDLDIRECT, LABVLDL  No components found for: NTPROBNP No results for input(s): PROBNP in the last 8760 hours. Recent Labs    07/19/20 0732  TSH 0.546    BMP Recent Labs    07/19/20 0732 07/20/20 0704  NA 138 136  K 3.2* 3.9  CL 106 109  CO2 21* 22  GLUCOSE 133* 74  BUN 13 7  CREATININE 0.85 0.68  CALCIUM 9.1 8.5*  GFRNONAA >60 >60    HEMOGLOBIN A1C Lab Results  Component Value Date   HGBA1C 5.1 07/20/2020   MPG 100 07/20/2020    IMPRESSION:    ICD-10-CM   1. Transient atrial fibrillation (HCC)  I48.91 EKG 12-Lead    2. Precordial chest pain  R07.2 PCV CARDIAC STRESS TEST    3. Marijuana use  F12.90        RECOMMENDATIONS: Satrina Magallanes is a 30 y.o. female whose past medical history and cardiac risk factors include: Transient atrial fibrillation, marijuana use.  Transient atrial fibrillation: Suspect alone/transient A. fib in the setting of GI upset noted during her recent hospitalization in June 2022.  She spontaneously converted to normal sinus rhythm and has remained in normal sinus since thereafter. CHA2DS2-VASc SCORE is 0.  No indication of long-term oral anticoagulant.  Continue to monitor.  Precordial pain: Noncardiac  in etiology. Associated with her nausea and vomiting. High sensitive troponin negative x2. EKG did not illustrate underlying injury pattern. Echocardiogram notes preserved LVEF without any significant valvular heart disease. Exam of the overall probability is low patient is concerned and therefore shared decision was to proceed with GXT to evaluate for exercise-induced ischemia.  Marijuana use: Urine drug screen during her recent hospitalization was positive for cannabinoids She confirms the use of marijuana on a daily basis. Educated on the importance of complete cessation from marijuana.  Once a GXT is performed I will inform the patient of her results either via MyChart or the office will call her with results.  As long as her within normal limits she can follow-up as needed or sooner if change in clinical status.  Patient is agreeable with the plan of care and is thankful for the care provided  FINAL MEDICATION LIST END OF ENCOUNTER: No orders of the defined types were placed in this encounter.   There are no discontinued medications.   Current Outpatient Medications:    ibuprofen (ADVIL) 800 MG tablet, Take 800 mg by mouth as needed., Disp: , Rfl:   Orders Placed This Encounter  Procedures   PCV CARDIAC STRESS TEST   EKG 12-Lead    There are no Patient Instructions on file for this visit.   --Continue cardiac medications as reconciled in final medication list. --Return in about 6 months (around 01/28/2021), or if symptoms worsen or fail to improve, for Follow up, Chest pain. Or sooner if needed. --Continue follow-up with your primary care physician regarding the management of your other chronic comorbid conditions.  Patient's questions and concerns were addressed to her satisfaction. She voices understanding of the instructions provided during this encounter.   This note was created using a voice recognition software as a result there may be grammatical errors inadvertently  enclosed that do not reflect the nature of this encounter. Every attempt is made to correct such errors.  Tessa Lerner, [place], [facility]  Pager: [phone] Office: [phone]

## 2020-07-29 ENCOUNTER — Encounter: Payer: Self-pay | Admitting: Cardiology

## 2020-07-29 ENCOUNTER — Ambulatory Visit: Payer: Medicaid Other | Admitting: Cardiology

## 2020-07-29 ENCOUNTER — Other Ambulatory Visit: Payer: Self-pay

## 2020-07-29 VITALS — BP 125/85 | HR 78 | Temp 98.4°F | Resp 16 | Ht 64.0 in | Wt 116.4 lb

## 2020-07-29 DIAGNOSIS — R072 Precordial pain: Secondary | ICD-10-CM

## 2020-07-29 DIAGNOSIS — I4891 Unspecified atrial fibrillation: Secondary | ICD-10-CM

## 2020-07-29 DIAGNOSIS — F1290 Cannabis use, unspecified, uncomplicated: Secondary | ICD-10-CM

## 2020-07-31 ENCOUNTER — Ambulatory Visit: Payer: Medicaid Other

## 2020-07-31 ENCOUNTER — Other Ambulatory Visit: Payer: Self-pay

## 2020-07-31 DIAGNOSIS — R072 Precordial pain: Secondary | ICD-10-CM

## 2020-08-03 NOTE — Progress Notes (Signed)
Called patient, NA, LMAM

## 2020-08-03 NOTE — Progress Notes (Signed)
Patient is aware of results she voiced understanding

## 2020-08-03 NOTE — Progress Notes (Signed)
Called pt no answer, left a vm to return the call.

## 2020-08-03 NOTE — Progress Notes (Signed)
Pt called back but phone was disconnected before I could give results

## 2020-08-07 ENCOUNTER — Emergency Department (HOSPITAL_COMMUNITY)
Admission: EM | Admit: 2020-08-07 | Discharge: 2020-08-07 | Disposition: A | Discharge status: Home / Self Care | Payer: Medicaid Other | Attending: Emergency Medicine | Admitting: Emergency Medicine

## 2020-08-07 ENCOUNTER — Encounter (HOSPITAL_COMMUNITY): Payer: Self-pay | Admitting: Emergency Medicine

## 2020-08-07 ENCOUNTER — Emergency Department (HOSPITAL_COMMUNITY): Payer: Medicaid Other

## 2020-08-07 ENCOUNTER — Telehealth: Payer: Self-pay | Admitting: Student

## 2020-08-07 DIAGNOSIS — R350 Frequency of micturition: Secondary | ICD-10-CM | POA: Diagnosis not present

## 2020-08-07 DIAGNOSIS — R112 Nausea with vomiting, unspecified: Secondary | ICD-10-CM | POA: Diagnosis present

## 2020-08-07 DIAGNOSIS — R1114 Bilious vomiting: Secondary | ICD-10-CM

## 2020-08-07 LAB — COMPREHENSIVE METABOLIC PANEL
ALT: 17 U/L (ref 0–44)
AST: 21 U/L (ref 15–41)
Albumin: 4 g/dL (ref 3.5–5.0)
Alkaline Phosphatase: 40 U/L (ref 38–126)
Anion gap: 9 (ref 5–15)
BUN: 6 mg/dL (ref 6–20)
CO2: 23 mmol/L (ref 22–32)
Calcium: 9 mg/dL (ref 8.9–10.3)
Chloride: 107 mmol/L (ref 98–111)
Creatinine, Ser: 0.72 mg/dL (ref 0.44–1.00)
GFR, Estimated: >60 mL/min (ref >60)
Glucose, Bld: 113 mg/dL — ABNORMAL HIGH (ref 70–99)
Potassium: 3.3 mmol/L — ABNORMAL LOW (ref 3.5–5.1)
Sodium: 139 mmol/L (ref 135–145)
Total Bilirubin: 0.4 mg/dL (ref 0.3–1.2)
Total Protein: 6.9 g/dL (ref 6.5–8.1)

## 2020-08-07 LAB — CBC WITH DIFFERENTIAL/PLATELET
Abs Immature Granulocytes: 0.01 10*3/uL (ref 0.00–0.07)
Basophils Absolute: 0 10*3/uL (ref 0.0–0.1)
Basophils Relative: 0 %
Eosinophils Absolute: 0 10*3/uL (ref 0.0–0.5)
Eosinophils Relative: 0 %
HCT: 36.1 % (ref 36.0–46.0)
Hemoglobin: 12.3 g/dL (ref 12.0–15.0)
Immature Granulocytes: 0 %
Lymphocytes Relative: 24 %
Lymphs Abs: 1.2 10*3/uL (ref 0.7–4.0)
MCH: 31.1 pg (ref 26.0–34.0)
MCHC: 34.1 g/dL (ref 30.0–36.0)
MCV: 91.4 fL (ref 80.0–100.0)
Monocytes Absolute: 0.3 10*3/uL (ref 0.1–1.0)
Monocytes Relative: 6 %
Neutro Abs: 3.6 10*3/uL (ref 1.7–7.7)
Neutrophils Relative %: 70 %
Platelets: 309 10*3/uL (ref 150–400)
RBC: 3.95 MIL/uL (ref 3.87–5.11)
RDW: 12 % (ref 11.5–15.5)
WBC: 5.1 10*3/uL (ref 4.0–10.5)
nRBC: 0 % (ref 0.0–0.2)

## 2020-08-07 LAB — URINALYSIS, ROUTINE W REFLEX MICROSCOPIC
Bilirubin Urine: NEGATIVE
Glucose, UA: NEGATIVE mg/dL
Hgb urine dipstick: NEGATIVE
Ketones, ur: 80 mg/dL — AB
Leukocytes,Ua: NEGATIVE
Nitrite: NEGATIVE
Protein, ur: NEGATIVE mg/dL
Specific Gravity, Urine: 1.015 (ref 1.005–1.030)
pH: 7 (ref 5.0–8.0)

## 2020-08-07 LAB — I-STAT BETA HCG BLOOD, ED (MC, WL, AP ONLY): I-stat hCG, quantitative: <5 m[IU]/mL (ref <5)

## 2020-08-07 LAB — LIPASE, BLOOD: Lipase: 26 U/L (ref 11–51)

## 2020-08-07 MED ORDER — ONDANSETRON 4 MG PO TBDP
8.0000 mg | ORAL_TABLET | Freq: Once | ORAL | Status: AC
Start: 2020-08-07 — End: 2020-08-07
  Administered 2020-08-07: 8 mg via ORAL
  Filled 2020-08-07: qty 2

## 2020-08-07 MED ORDER — ONDANSETRON 4 MG PO TBDP: 4.0000 mg | ORAL_TABLET | Freq: Three times a day (TID) | ORAL | 0 refills | Status: AC | PRN

## 2020-08-07 MED ORDER — POTASSIUM CHLORIDE CRYS ER 20 MEQ PO TBCR
40.0000 meq | EXTENDED_RELEASE_TABLET | Freq: Once | ORAL | Status: AC
  Administered 2020-08-07: 40 meq via ORAL
  Filled 2020-08-07: qty 2

## 2020-08-07 NOTE — Discharge Instructions (Addendum)
You came to the emergency department today to be evaluated for your nausea and vomiting.  Your physical exam was reassuring.  Your lab work showed that your potassium was slightly low therefore you received oral potassium.  The chest x-ray showed that you have scoliosis of your mid to low back. Please follow-up with your primary care provider regarding this finding.    Get help right away if: You have pain in your chest, neck, arm, or jaw. You feel extremely weak or you faint. You have persistent vomiting. You have vomit that is bright red or looks like black coffee grounds. You have bloody or black stools or stools that look like tar. You have a severe headache, a stiff neck, or both. You have severe pain, cramping, or bloating in your abdomen. You have difficulty breathing, or you are breathing very quickly. Your heart is beating very quickly. Your skin feels cold and clammy. You feel confused. You have signs of dehydration, such as: Dark urine, very little urine, or no urine. Cracked lips. Dry mouth. Sunken eyes. Sleepiness. Weakness.

## 2020-08-07 NOTE — Telephone Encounter (Signed)
Patient called the office with concerns of headache, abdominal pain, nausea and vomiting, as well as shortness of breath.  Patient was recently evaluated in the emergency department for similar symptoms on 07/19/2020 with hypokalemia.  Patient states symptoms feel similar to this prior episode.  Advised patient to seek further evaluation at urgent care or emergency department, she verbalized understanding agreement.

## 2020-08-07 NOTE — ED Provider Notes (Signed)
Emergency Medicine Provider Triage Evaluation Note  Jaime Shelton , a 30 y.o. female  was evaluated in triage.  Pt complains of intermittent n/v that started this AM upon waking. She states she felt tingling with intermittent CP and then began experiencing her n/v. This alleviated her tingling and CP. Still endorses nausea. No SOB. No abdominal pain.   Physical Exam  BP (!) 144/94   Pulse 97   Temp 98.2 F (36.8 C) (Oral)   Resp (!) 32   Ht 5\' 4"  (1.626 m)   Wt 53.5 kg   SpO2 100%   BMI 20.25 kg/m  Gen:   Awake, no distress   Resp:  Normal effort  MSK:   Moves extremities without difficulty  Other:    Medical Decision Making  Medically screening exam initiated at 12:16 PM.  Appropriate orders placed.  Jaime Shelton was informed that the remainder of the evaluation will be completed by another provider, this initial triage assessment does not replace that evaluation, and the importance of remaining in the ED until their evaluation is complete.     Jaime Ehlers, PA-C 08/07/20 1217    10/08/20, MD 08/09/20 [phone]

## 2020-08-07 NOTE — ED Provider Notes (Signed)
[facility] EMERGENCY DEPARTMENT Provider Note   CSN: [id number] Arrival date & time: 08/07/20  1133     History Chief Complaint  Patient presents with   Emesis    Jaime Shelton is a 30 y.o. female with a history of atrial fibrillation.  Patient presents to the emergency department with a chief complaint of nausea and vomiting.  Patient reports that she woke this morning at 0 600 with nausea.  Patient reports that she had 4 episodes of vomiting.  Patient reports that emesis started as clear and then became yellow in color.  Patient denies any frank red blood or coffee-ground emesis.  Patient did not try any modalities to help with her nausea.  Patient states that her stomach was "tight."  During this episode of nausea.  Patient denied any abdominal pain.  Patient reports that the "tight," feeling moved into her chest for a few minutes and was alleviated after vomiting.  Patient states that she became anxious and started hyperventilating which led to a "tingling," sensation throughout her body.  Patient denies any loss of consciousness associated with this episode.  Patient endorses urinary frequency.  Patient denies any fever, chills, diarrhea, constipation, blood in stool, melena, dysuria, hematuria, vaginal pain, vaginal bleeding, abnormal vaginal discharge, palpitations, numbness to extremities, weakness to extremities, facial asymmetry, slurred speech.  LMP 6/26.  G2 P1-0-1-1.  Patient denies any history of abdominal surgeries.  Patient endorses marijuana use.  Denies any frequent alcohol or NSAID use.   Emesis Associated symptoms: no abdominal pain, no chills, no diarrhea, no fever and no headaches       Past Medical History:  Diagnosis Date   Abdominal pain    Hx: of   Complication of anesthesia    Headache(784.0)    Hx: of Migraines   Heart murmur    PONV (postoperative nausea and vomiting)    Shortness of breath    Hx: of    Patient Active Problem  List   Diagnosis Date Noted   Emesis 07/19/2020   Precordial chest pain    Atrial fibrillation (HCC)    Hypokalemia    Cardiac murmur    Marijuana use     Past Surgical History:  Procedure Laterality Date   AXILLARY SURGERY  06/26/2012   cyst removed   EAR CYST EXCISION Left 06/26/2012   Procedure: excison of left axillary CYST REMOVAL;  Surgeon: Madilyn Hook, DO;  Location: [facility] OR;  Service: General;  Laterality: Left;   NO PAST SURGERIES     REFRACTIVE SURGERY       OB History     Gravida  1   Para  1   Term      Preterm      AB      Living  1      SAB      IAB      Ectopic      Multiple      Live Births              Family History  Problem Relation Age of Onset   HIV Father    Hypertension Mother     Social History   Tobacco Use   Smoking status: Never   Smokeless tobacco: Never  Vaping Use   Vaping Use: Never used  Substance Use Topics   Alcohol use: Not Currently    Comment: occasional   Drug use: Yes    Types: Marijuana    Comment:  Daily    Home Medications Prior to Admission medications   Medication Sig Start Date End Date Taking? Authorizing Provider  ibuprofen (ADVIL) 800 MG tablet Take 800 mg by mouth 3 (three) times daily as needed for mild pain.   Yes [provider]  promethazine (PHENERGAN) 12.5 MG suppository Place 1 suppository (12.5 mg total) rectally every 6 (six) hours as needed for nausea or vomiting. 11/27/18 05/24/19  Jaynee Eagles, PA-C  venlafaxine (EFFEXOR) 100 MG tablet Take by mouth 2 (two) times daily.  05/24/19  [provider]    Allergies    Patient has no known allergies.  Review of Systems   Review of Systems  Constitutional:  Negative for chills and fever.  Eyes:  Negative for visual disturbance.  Respiratory:  Negative for shortness of breath.   Cardiovascular:  Negative for chest pain, palpitations and leg swelling.  Gastrointestinal:  Positive for nausea and vomiting. Negative for  abdominal distention, abdominal pain, anal bleeding, blood in stool, constipation, diarrhea and rectal pain.  Genitourinary:  Positive for frequency. Negative for difficulty urinating, dysuria, flank pain, hematuria, vaginal bleeding, vaginal discharge and vaginal pain.  Musculoskeletal:  Negative for back pain and neck pain.  Skin:  Negative for color change and rash.  Neurological:  Negative for dizziness, tremors, seizures, syncope, facial asymmetry, speech difficulty, weakness, light-headedness, numbness and headaches.  Psychiatric/Behavioral:  Negative for confusion.    Physical Exam Updated Vital Signs BP (!) 131/91   Pulse (!) 57   Temp 98.3 F (36.8 C) (Oral)   Resp 18   Ht '5\' 4"'  (1.626 m)   Wt 53.5 kg   SpO2 100%   BMI 20.25 kg/m   Physical Exam Vitals and nursing note reviewed.  Constitutional:      General: She is not in acute distress.    Appearance: She is not ill-appearing, toxic-appearing or diaphoretic.  HENT:     Head: Normocephalic.  Eyes:     General: No scleral icterus.       Right eye: No discharge.        Left eye: No discharge.  Cardiovascular:     Rate and Rhythm: Normal rate and regular rhythm.     Pulses:          Radial pulses are 2+ on the right side and 2+ on the left side.     Heart sounds: Normal heart sounds, S1 normal and S2 normal. No murmur heard. Pulmonary:     Effort: Pulmonary effort is normal. No tachypnea, bradypnea or respiratory distress.     Breath sounds: Normal breath sounds. No stridor.  Abdominal:     General: Abdomen is flat. Bowel sounds are normal. There is no distension. There are no signs of injury.     Palpations: Abdomen is soft. There is no mass or pulsatile mass.     Tenderness: There is no abdominal tenderness. There is no right CVA tenderness, left CVA tenderness, guarding or rebound.  Musculoskeletal:     Right lower leg: No swelling or tenderness. No edema.     Left lower leg: No swelling or tenderness. No edema.   Skin:    General: Skin is warm and dry.  Neurological:     General: No focal deficit present.     Mental Status: She is alert and oriented to person, place, and time.     GCS: GCS eye subscore is 4. GCS verbal subscore is 5. GCS motor subscore is 6.  Psychiatric:  Behavior: Behavior is cooperative.    ED Results / Procedures / Treatments   Labs (all labs ordered are listed, but only abnormal results are displayed) Labs Reviewed  COMPREHENSIVE METABOLIC PANEL - Abnormal; Notable for the following components:      Result Value   Potassium 3.3 (*)    Glucose, Bld 113 (*)    All other components within normal limits  URINALYSIS, ROUTINE W REFLEX MICROSCOPIC - Abnormal; Notable for the following components:   APPearance HAZY (*)    Ketones, ur 80 (*)    All other components within normal limits  CBC WITH DIFFERENTIAL/PLATELET  LIPASE, BLOOD  I-STAT BETA HCG BLOOD, ED ([facility], [facility], [facility] ONLY)    EKG EKG Interpretation  Date/Time:  Friday August 07 2020 11:41:38 EDT Ventricular Rate:  84 PR Interval:  138 QRS Duration: 78 QT Interval:  384 QTC Calculation: 453 R Axis:   65 Text Interpretation: Normal sinus rhythm Cannot rule out Anterior infarct , age undetermined Abnormal ECG No significant change since last tracing Confirmed by Wandra Arthurs ([id number]) on 08/07/2020 6:43:55 PM  Radiology DG Chest Portable 1 View  Result Date: 08/07/2020 CLINICAL DATA:  Chest tightness. Emesis. Chest pain and vomiting. Anxiety. EXAM: PORTABLE CHEST 1 VIEW COMPARISON:  07/19/2020. FINDINGS: Mild S-shaped thoracolumbar scoliosis. The lungs appear clear. Cardiac and mediastinal contours normal. No pleural effusion identified. IMPRESSION: 1.  No active cardiopulmonary disease is radiographically apparent. 2. Mild S-shaped thoracolumbar scoliosis. Electronically Signed   By: Van Clines M.D.   On: 08/07/2020 18:29    Procedures Procedures   Medications Ordered in ED Medications  potassium  chloride SA (KLOR-CON) CR tablet 40 mEq (has no administration in time range)  ondansetron (ZOFRAN-ODT) disintegrating tablet 8 mg (8 mg Oral Given 08/07/20 1230)    ED Course  I have reviewed the triage vital signs and the nursing notes.  Pertinent labs & imaging results that were available during my care of the patient were reviewed by me and considered in my medical decision making (see chart for details).    MDM Rules/Calculators/A&P                          Alert 30 year old female in no acute distress, no she will hernia or umbilical hernia noted.  Ntoxic appearing.  Patient presents with a chief plaint of nausea and vomiting.  Reports 4 episodes of emesis described as clear to yellow in color.  No frank red blood or hematemesis.  No abdominal pain associated with her symptoms.  On physical exam abdomen is soft, nondistended, nontender.  Normoactive bowel sounds.  No guarding, rebound tenderness.  Lab work was obtained while patient was in triage.  CBC is unremarkable. CMP shows potassium 3.3; likely secondary to nausea and vomiting.  We will give patient oral potassium. AST, ALT, alk phos, total bili all within normal limits; low suspicion for hepatobiliary disease Lipase within normal limits; low suspicion for acute pancreatitis Urinalysis shows no signs of infection; low suspicion for urinary tract infection or pyelonephritis. Pregnancy test negative; low suspicion for intrauterine or ectopic pregnancy.  Patient has had improvement in her symptoms with oral Zofran in emergency department.  Will p.o. challenge, if patient able to handle p.o. intake without further episodes of nausea or vomiting plan to discharge with prescription for oral Zofran.  Patient was advised to refrain from smoking marijuana as recurrent nausea and vomiting may be due to cannabinoid hyperemesis syndrome.  Patient  endorsed episode of "tight," feeling to chest during episodes of her nausea and vomiting.  This was  resolved after vomiting.  Patient was seen by cardiology in outpatient setting on 07/29/2020.  Patient's precordial chest pain was determined to be noncardiac in nature. EKG obtained shows normal sinus rhythm with no significant change from previous tracing. Chest x-ray shows no active cardiopulmonary disease.  Scoliosis was noted on chest x-ray.  Patient reports she has never been told of scoliosis.  We will have patient follow-up with her primary care provider regarding scoliosis.  Patient was able to handle p.o. intake without difficulty.  Patient is dynamically stable.  Will discharge patient at this time.  Patient given strict return precautions.  Patient expressed understanding of all instructions and is agreeable with this plan.  Final Clinical Impression(s) / ED Diagnoses Final diagnoses:  Bilious vomiting with nausea    Rx / DC Orders ED Discharge Orders          Ordered    ondansetron (ZOFRAN ODT) 4 MG disintegrating tablet  Every 8 hours PRN        08/07/20 1842             Loni Beckwith, PA-C 08/07/20 2029    Drenda Freeze, MD 08/07/20 2147

## 2020-08-07 NOTE — ED Notes (Signed)
Patient remains free from N/V after PO challenge of ginger ale. Patient left ED ambulatory with steady independent gait and is A/O.

## 2020-08-07 NOTE — ED Triage Notes (Signed)
Pt arrive POV from home for c/o SOB, intermittent CP and vomiting that started today at 6 am. Pt very anxious during triage. Pt frequently seen for panic attack.

## 2020-10-01 ENCOUNTER — Ambulatory Visit: Payer: Self-pay | Admitting: *Deleted

## 2020-10-01 NOTE — Telephone Encounter (Signed)
Reason for Disposition  [1] MODERATE rectal bleeding (small blood clots, passing blood without stool, or toilet water turns red) AND [2] more than once a day  Answer Assessment - Initial Assessment Questions 1. APPEARANCE of BLOOD: "What color is it?" "Is it passed separately, on the surface of the stool, or mixed in with the stool?"      Just blood in commode after trying to defecate  2. AMOUNT: "How much blood was passed?"      na 3. FREQUENCY: "How many times has blood been passed with the stools?"     Blood noted without any stool 4. ONSET: "When was the blood first seen in the stools?" (Days or weeks)      No blood seen in stool  5. DIARRHEA: "Is there also some diarrhea?" If Yes, ask: "How many diarrhea stools in the past 24 hours?"      Diarrhea x 10 times  6. CONSTIPATION: "Do you have constipation?" If Yes, ask: "How bad is it?"     Na 7. RECURRENT SYMPTOMS: "Have you had blood in your stools before?" If Yes, ask: "When was the last time?" and "What happened that time?"      No  8. BLOOD THINNERS: "Do you take any blood thinners?" (e.g., Coumadin/warfarin, Pradaxa/dabigatran, aspirin)     no 9. OTHER SYMPTOMS: "Do you have any other symptoms?"  (e.g., abdomen pain, vomiting, dizziness, fever)     Abdominal pain , nausea , breaks out into sweat with abdominal pain 10. PREGNANCY: "Is there any chance you are pregnant?" "When was your last menstrual period?"       na  Protocols used: Rectal Bleeding-A-AH

## 2020-10-01 NOTE — Telephone Encounter (Signed)
C/o rectal bleeding and blood in commode when trying to defecate. C/o abdominal pain x 3 days . Pain in rectum during bowel movements. Reports bright red blood in commode and wiping. Reports diarrheal episodes x 10 today. This is the first time seeing blood. C/o "breaking out in a sweat" when abdominal pain occurs prior to diarrheal episodes. Denies dizziness, difficulty breathing, rapid pulse, no vomiting. C/o nausea. Instructed patient to go to ED now for evaluation. Care advise given. Patient verbalized understanding of care advise and to call 911 if symptoms worsen.

## 2021-12-10 IMAGING — DX DG CHEST 1V PORT
1 series · 1 of 1 positions shown · non-contrast
Comparison: 07/26/2010

CLINICAL DATA: Atrial fibrillation

EXAM:
PORTABLE CHEST 1 VIEW

[chest]
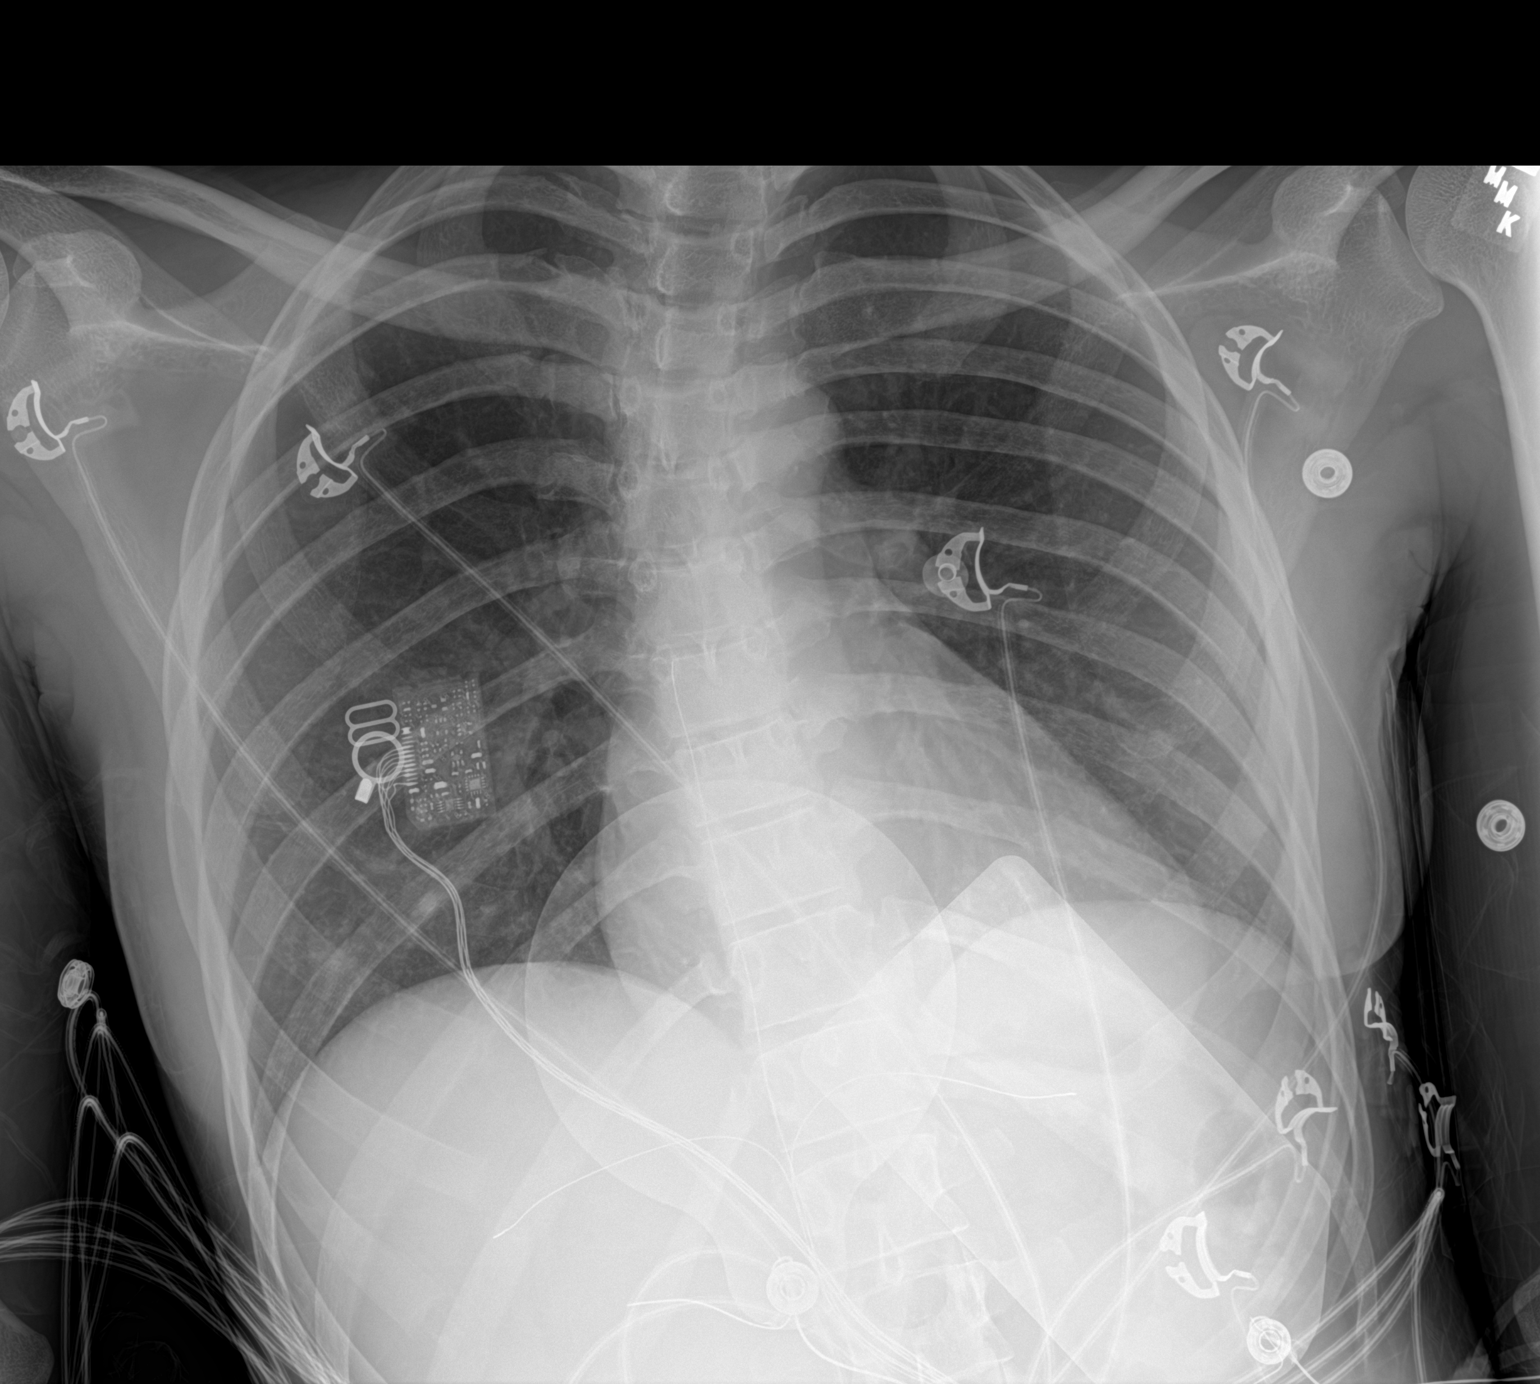

[1 of 1 positions shown; findings below may reference images not displayed]

FINDINGS: Defibrillator leads in place. Artifact from EKG leads. There is no
edema, consolidation, effusion, or pneumothorax. Scoliosis.
IMPRESSION: Stable portable chest.
